# Patient Record
Sex: Female | Born: 1980 | Hispanic: No | Marital: Married | Smoking: Never smoker
Health system: Southern US, Community
[De-identification: ages and names within clinical notes are randomized; demographics above are authoritative.]

## PROBLEM LIST (undated history)

## (undated) ENCOUNTER — Inpatient Hospital Stay (HOSPITAL_COMMUNITY): Payer: Self-pay

## (undated) DIAGNOSIS — B1910 Unspecified viral hepatitis B without hepatic coma: Secondary | ICD-10-CM

## (undated) DIAGNOSIS — E611 Iron deficiency: Secondary | ICD-10-CM

---

## 2008-08-06 ENCOUNTER — Ambulatory Visit (HOSPITAL_COMMUNITY): Admission: RE | Admit: 2008-08-06 | Discharge: 2008-08-06 | Payer: Self-pay | Admitting: Obstetrics

## 2008-09-21 ENCOUNTER — Inpatient Hospital Stay (HOSPITAL_COMMUNITY): Admission: AD | Admit: 2008-09-21 | Discharge: 2008-09-21 | Payer: Self-pay | Admitting: Obstetrics

## 2008-09-28 ENCOUNTER — Encounter (INDEPENDENT_AMBULATORY_CARE_PROVIDER_SITE_OTHER): Payer: Self-pay | Admitting: Obstetrics

## 2008-09-28 ENCOUNTER — Inpatient Hospital Stay (HOSPITAL_COMMUNITY): Admission: AD | Admit: 2008-09-28 | Discharge: 2008-09-30 | Payer: Self-pay | Admitting: Obstetrics

## 2010-01-17 NOTE — L&D Delivery Note (Signed)
Delivery Summary for [employer]  Labor Events:   Preterm labor:   Rupture date:   Rupture time:   Rupture type:   Fluid Color:   Induction:   Augmentation:   Complications:   Cervical ripening:          Delivery:   Episiotomy:   Lacerations:   Repair suture:   Repair # of packets:   Blood loss (ml): 300   Information for the patient's newborn:  Katelyn Webb, Katelyn Webb [[id number]]    Delivery 07/24/2010 7:20 AM by  Vaginal, Spontaneous Delivery Sex:  female Gestational Age: <None> Delivery Clinician:  Greig Right Living?: Yes        APGARS  One minute Five minutes Ten minutes  Skin color: 1   1      Heart rate: 2   2      Grimace: 2   2      Muscle tone: 2   2      Breathing: 2   2      Totals: 9  9      Presentation/position: Vertex  Left Occiput Anterior Resuscitation: None  Cord information: 3 vessels   Disposition of cord blood: No    Blood gases sent? No Complications: None  Placenta: Delivered: 07/24/2010 7:23 AM  Spontaneous  Intact appearance Newborn Measurements: Weight: 6 lb 13.7 oz (3110 g)  Height: 19"  Head circumference: 33 cm  Chest circumference: 33 cm  Other providers: Delivery Nurse Ginger Cher Nakai  Additional  information: Forceps:   Vacuum:   Breech:   Observed anomalies       Pt progressed quickly to C/C?+2 with SROM before antibiotics could be administered.  Pt had NSVD without complications.  Placenta separated spontaneously and delivered via CCT, intact with 3VC.  No lacs.

## 2010-02-08 ENCOUNTER — Encounter: Payer: Self-pay | Admitting: Obstetrics

## 2010-02-16 ENCOUNTER — Inpatient Hospital Stay (HOSPITAL_COMMUNITY): Admission: AD | Admit: 2010-02-16 | Payer: Self-pay | Source: Home / Self Care | Admitting: Obstetrics & Gynecology

## 2010-02-17 ENCOUNTER — Other Ambulatory Visit: Payer: Self-pay | Admitting: Family Medicine

## 2010-02-17 DIAGNOSIS — Z3689 Encounter for other specified antenatal screening: Secondary | ICD-10-CM

## 2010-02-17 LAB — ABO/RH: RH Type: POSITIVE

## 2010-02-17 LAB — RUBELLA ANTIBODY, IGM: Rubella: IMMUNE

## 2010-02-17 LAB — HIV ANTIBODY (ROUTINE TESTING W REFLEX): HIV: NONREACTIVE

## 2010-02-18 LAB — HEPATITIS B SURFACE ANTIGEN: Hepatitis B Surface Ag: POSITIVE

## 2010-02-22 ENCOUNTER — Encounter (HOSPITAL_COMMUNITY): Payer: Self-pay

## 2010-02-22 ENCOUNTER — Ambulatory Visit (HOSPITAL_COMMUNITY)
Admission: RE | Admit: 2010-02-22 | Discharge: 2010-02-22 | Disposition: A | Discharge status: Home / Self Care | Payer: Medicaid Other | Source: Ambulatory Visit | Attending: Family Medicine | Admitting: Family Medicine

## 2010-02-22 DIAGNOSIS — Z363 Encounter for antenatal screening for malformations: Secondary | ICD-10-CM | POA: Insufficient documentation

## 2010-02-22 DIAGNOSIS — Z1389 Encounter for screening for other disorder: Secondary | ICD-10-CM | POA: Insufficient documentation

## 2010-02-22 DIAGNOSIS — O358XX0 Maternal care for other (suspected) fetal abnormality and damage, not applicable or unspecified: Secondary | ICD-10-CM | POA: Insufficient documentation

## 2010-02-22 DIAGNOSIS — Z3689 Encounter for other specified antenatal screening: Secondary | ICD-10-CM

## 2010-03-29 ENCOUNTER — Other Ambulatory Visit: Payer: Self-pay | Admitting: *Deleted

## 2010-03-29 ENCOUNTER — Ambulatory Visit
Admission: RE | Admit: 2010-03-29 | Discharge: 2010-03-29 | Disposition: A | Discharge status: Home / Self Care | Payer: No Typology Code available for payment source | Source: Ambulatory Visit | Attending: *Deleted | Admitting: *Deleted

## 2010-03-29 DIAGNOSIS — R7611 Nonspecific reaction to tuberculin skin test without active tuberculosis: Secondary | ICD-10-CM

## 2010-04-23 LAB — CBC
HCT: 33.6 % — ABNORMAL LOW (ref 36.0–46.0)
HCT: 37.2 % (ref 36.0–46.0)
Hemoglobin: 11.2 g/dL — ABNORMAL LOW (ref 12.0–15.0)
MCV: 88.5 fL (ref 78.0–100.0)
Platelets: 84 10*3/uL — ABNORMAL LOW (ref 150–400)
RBC: 3.79 MIL/uL — ABNORMAL LOW (ref 3.87–5.11)
RBC: 4.2 MIL/uL (ref 3.87–5.11)
WBC: 7.1 10*3/uL (ref 4.0–10.5)

## 2010-04-23 LAB — RPR: RPR Ser Ql: NONREACTIVE

## 2010-05-19 LAB — STREP B DNA PROBE: GBS: POSITIVE

## 2010-05-25 ENCOUNTER — Inpatient Hospital Stay (HOSPITAL_COMMUNITY)
Admission: AD | Admit: 2010-05-25 | Discharge: 2010-05-26 | DRG: 781 | Disposition: A | Discharge status: Home / Self Care | Payer: Medicaid Other | Source: Ambulatory Visit | Attending: Obstetrics and Gynecology | Admitting: Obstetrics and Gynecology

## 2010-05-25 DIAGNOSIS — O9989 Other specified diseases and conditions complicating pregnancy, childbirth and the puerperium: Secondary | ICD-10-CM

## 2010-05-25 DIAGNOSIS — Z22330 Carrier of Group B streptococcus: Secondary | ICD-10-CM

## 2010-05-25 DIAGNOSIS — R8271 Bacteriuria: Secondary | ICD-10-CM

## 2010-05-25 DIAGNOSIS — O99891 Other specified diseases and conditions complicating pregnancy: Principal | ICD-10-CM | POA: Diagnosis present

## 2010-06-01 NOTE — Discharge Summary (Signed)
  Katelyn Webb, Katelyn Webb            ACCOUNT NO.:  [id number]  MEDICAL RECORD NO.:  [id number]           PATIENT TYPE:  I  LOCATION:  9159                          FACILITY:  [facility]  PHYSICIAN:  Catalina Antigua, MD     DATE OF BIRTH:  [birth year]  DATE OF ADMISSION:  05/25/2010 DATE OF DISCHARGE:  05/26/2010                              DISCHARGE SUMMARY   ADMISSION DIAGNOSIS:  Asymptomatic bacteriuria with Pseudomonas and Group B streptococcus.  HISTORY OF PRESENT ILLNESS:  This is a 30 year old G3, P2-0-0-2 at 60 and 1 week with asymptomatic bacteriuria who was admitted to initiate a course of IV antibiotics for a treatment of her Pseudomonas and Group B strep bacteriuria.  The patient had been previously counseled on either IV antibiotics versus oral antibiotics with the use of a quinolone. Risks and benefits of both treatment options were explained.  The patient verbalized understanding and all questions were answered.  The patient opted for IV antibiotics.  The patient was admitted overnight for initiation of IV antibiotics with ceftazidime 1 g IV q.8 h. and home health care was initiated to continue IV antibiotic administration for a total of 7 days while the patient is at home.  Throughout her hospital stay, the patient remained stable with normal vital signs and reassuring fetal heart rate tracing.  No immediate allergic reactions were noted to the antibiotics.  On the day of discharge, the patient continued to be asymptomatic without any complaints.  Fetal monitoring revealed a reassuring tracing and contractions pattern were found to be 3-7 minutes.  The patient was unaware of her contractions.  Sterile vaginal exam showed cervix to be closed, firm and posterior.  Discharge instructions were provided to the patient.  The patient was advised to return to the MAU if she developed a rash or hives after prolonged use of antibiotics or if she developed contractions every 3-5 minutes  or every 10-15 minutes, decreased fetal movement or leakage of fluid.  The patient is scheduled to follow up with her scheduled appointment at the healthcare department on Jun 02, 2010.     Catalina Antigua, MD     PC/MEDQ  D:  05/26/2010  T:  05/26/2010  Job:  [id number]  Electronically Signed by Catalina Antigua  on 06/01/2010 07:02:47 PM

## 2010-07-24 ENCOUNTER — Inpatient Hospital Stay (HOSPITAL_COMMUNITY)
Admission: AD | Admit: 2010-07-24 | Discharge: 2010-07-26 | DRG: 775 | Disposition: A | Discharge status: Home / Self Care | Payer: Medicaid Other | Source: Ambulatory Visit | Attending: Family Medicine | Admitting: Family Medicine

## 2010-07-24 ENCOUNTER — Encounter (HOSPITAL_COMMUNITY): Payer: Self-pay | Admitting: *Deleted

## 2010-07-24 ENCOUNTER — Inpatient Hospital Stay (HOSPITAL_COMMUNITY): Payer: Medicaid Other | Admitting: Family Medicine

## 2010-07-24 DIAGNOSIS — B1910 Unspecified viral hepatitis B without hepatic coma: Secondary | ICD-10-CM | POA: Diagnosis present

## 2010-07-24 LAB — CBC
HCT: 39.4 % (ref 36.0–46.0)
Hemoglobin: 12.7 g/dL (ref 12.0–15.0)
MCV: 88.3 fL (ref 78.0–100.0)
RBC: 4.46 MIL/uL (ref 3.87–5.11)
RDW: 13.3 % (ref 11.5–15.5)
WBC: 9.8 10*3/uL (ref 4.0–10.5)

## 2010-07-24 LAB — RPR: RPR Ser Ql: NONREACTIVE

## 2010-07-24 MED ORDER — OXYCODONE-ACETAMINOPHEN 5-325 MG PO TABS
1.0000 | ORAL_TABLET | ORAL | Status: DC | PRN
  Administered 2010-07-24: 1 via ORAL
  Filled 2010-07-24: qty 1

## 2010-07-24 MED ORDER — PRENATAL PLUS 27-1 MG PO TABS
1.0000 | ORAL_TABLET | Freq: Every day | ORAL | Status: DC
  Administered 2010-07-24 – 2010-07-26 (×3): 1 via ORAL
  Filled 2010-07-24 (×3): qty 1

## 2010-07-24 MED ORDER — LIDOCAINE HCL (PF) 1 % IJ SOLN
1.0000 mL | Freq: Once | INTRAMUSCULAR | Status: AC | PRN
  Filled 2010-07-24: qty 2

## 2010-07-24 MED ORDER — LACTATED RINGERS IV SOLN: INTRAVENOUS | Status: DC

## 2010-07-24 MED ORDER — FLEET ENEMA 7-19 GM/118ML RE ENEM
1.0000 | ENEMA | RECTAL | Status: DC | PRN
  Filled 2010-07-24: qty 1

## 2010-07-24 MED ORDER — IBUPROFEN 600 MG PO TABS
ORAL_TABLET | ORAL | Status: AC
  Administered 2010-07-24: 600 mg via ORAL
  Filled 2010-07-24: qty 1

## 2010-07-24 MED ORDER — SENNOSIDES-DOCUSATE SODIUM 8.6-50 MG PO TABS
1.0000 | ORAL_TABLET | Freq: Every day | ORAL | Status: DC
  Administered 2010-07-24: 1 via ORAL
  Administered 2010-07-25: 2 via ORAL

## 2010-07-24 MED ORDER — ONDANSETRON HCL 4 MG PO TABS: 4.0000 mg | ORAL_TABLET | ORAL | Status: DC | PRN

## 2010-07-24 MED ORDER — RHO D IMMUNE GLOBULIN 1500 UNIT/2ML IJ SOLN: 300.0000 ug | Freq: Once | INTRAMUSCULAR | Status: DC

## 2010-07-24 MED ORDER — IBUPROFEN 600 MG PO TABS
600.0000 mg | ORAL_TABLET | Freq: Four times a day (QID) | ORAL | Status: DC | PRN
  Administered 2010-07-24 – 2010-07-25 (×2): 600 mg via ORAL
  Filled 2010-07-24 (×4): qty 1

## 2010-07-24 MED ORDER — ONDANSETRON HCL 4 MG/2ML IJ SOLN: 4.0000 mg | INTRAMUSCULAR | Status: DC | PRN

## 2010-07-24 MED ORDER — ONDANSETRON HCL 4 MG/2ML IJ SOLN: 4.0000 mg | Freq: Four times a day (QID) | INTRAMUSCULAR | Status: DC | PRN

## 2010-07-24 MED ORDER — LANOLIN HYDROUS EX OINT
TOPICAL_OINTMENT | CUTANEOUS | Status: DC | PRN
  Filled 2010-07-24: qty 28

## 2010-07-24 MED ORDER — LIDOCAINE HCL (PF) 1 % IJ SOLN
INTRAMUSCULAR | Status: AC
  Filled 2010-07-24: qty 30

## 2010-07-24 MED ORDER — WITCH HAZEL-GLYCERIN EX PADS: MEDICATED_PAD | CUTANEOUS | Status: DC | PRN

## 2010-07-24 MED ORDER — DIPHENHYDRAMINE HCL 25 MG PO CAPS: 25.0000 mg | ORAL_CAPSULE | Freq: Four times a day (QID) | ORAL | Status: DC | PRN

## 2010-07-24 MED ORDER — ZOLPIDEM TARTRATE 5 MG PO TABS: 5.0000 mg | ORAL_TABLET | Freq: Every evening | ORAL | Status: DC | PRN

## 2010-07-24 MED ORDER — ACETAMINOPHEN 325 MG PO TABS: 650.0000 mg | ORAL_TABLET | ORAL | Status: DC | PRN

## 2010-07-24 MED ORDER — SIMETHICONE 80 MG PO CHEW: 80.0000 mg | CHEWABLE_TABLET | ORAL | Status: DC | PRN

## 2010-07-24 MED ORDER — OXYTOCIN 20 UNITS IN LACTATED RINGERS INFUSION - SIMPLE: 125.0000 mL/h | Freq: Once | INTRAVENOUS | Status: DC

## 2010-07-24 MED ORDER — LACTATED RINGERS IV SOLN: 500.0000 mL | Freq: Once | INTRAVENOUS | Status: AC | PRN

## 2010-07-24 MED ORDER — BENZOCAINE-MENTHOL 20-0.5 % EX AERO: 1.0000 "application " | INHALATION_SPRAY | CUTANEOUS | Status: DC | PRN

## 2010-07-24 MED ORDER — CITRIC ACID-SODIUM CITRATE 334-500 MG/5ML PO SOLN
30.0000 mL | ORAL | Status: DC | PRN
  Filled 2010-07-24: qty 30

## 2010-07-24 MED ORDER — IBUPROFEN 600 MG PO TABS
600.0000 mg | ORAL_TABLET | Freq: Four times a day (QID) | ORAL | Status: DC
  Administered 2010-07-24 – 2010-07-26 (×6): 600 mg via ORAL
  Filled 2010-07-24 (×2): qty 1

## 2010-07-24 MED ORDER — OXYTOCIN 10 UNIT/ML IJ SOLN
INTRAMUSCULAR | Status: AC
  Administered 2010-07-24: 10 [IU]
  Filled 2010-07-24: qty 2

## 2010-07-24 MED ORDER — ERYTHROMYCIN 5 MG/GM OP OINT
TOPICAL_OINTMENT | OPHTHALMIC | Status: AC
  Administered 2010-07-24: 07:00:00 via OPHTHALMIC
  Filled 2010-07-24: qty 1

## 2010-07-24 NOTE — Progress Notes (Signed)
PT ARRIVED - IN LOBBY- UNCOMFORTABLE- BROUGHT TO RM 4 VIA W/C.   G3P2, EDC- 07-26-2010 NKA BLOODYSHOW   , POS GBS   VE- 6 CM - INTACT, BLOODYSHOW. FHR- 145.   PT HOLLERING TANYA- CALLED DR LAVOIE.  PNR SAYS HD- CALLED FRAN, CNM  .  TO RM 166 VIA W/C

## 2010-07-24 NOTE — H&P (Signed)
Katelyn Webb is a 30 y.o. female presenting for labor Maternal Medical History:  Reason for admission: Reason for admission: contractions.  Contractions: Onset was 1-2 hours ago.   Frequency: regular.   Duration is approximately 60 seconds.   Perceived severity is strong.    Fetal activity: Perceived fetal activity is normal.   Last perceived fetal movement was within the past hour.    Prenatal complications: Infection.   + HBSaG     No past medical history on file. No past surgical history on file. Family History: family history is not on file. Social History:  does not have a smoking history on file. She does not have any smokeless tobacco history on file. Her alcohol and drug histories not on file.  ROS    Last menstrual period 10/19/2009, unknown if currently breastfeeding. Exam Physical Exam  Prenatal labs: ABO, Rh:   Antibody:   Rubella:   RPR:    HBsAg:    HIV:    GBS:     Assessment/Plan: G3P2002 @ 39 weeks in active labor.  Cx 6cms100%/-1 sta BBOW.  FHR Category 1.  Admit to L&D  Katelyn Webb 07/24/2010, 7:47 AM

## 2010-07-25 NOTE — Progress Notes (Signed)
Post Partum Day1 Subjective:  no complaints.   Objective: Blood pressure 99/66, pulse 73, temperature 97.6 F (36.4 C), temperature source Oral, resp. rate 18, height 5\' 1"  (1.549 m), weight 67.132 kg (148 lb), last menstrual period 10/19/2009, unknown if currently breastfeeding.  Physical Exam:  General:  Lochia: {Desc; appropriate/ Uterine Fundus: {Desc; firm/ Incision:  DVT Evaluation: {Exam;WNL   Basename 07/24/10 1008  HGB 12.7  HCT 39.4    Assessment/Plan:  Routine pp care   LOS: 1 day   Zerita Boers 07/25/2010, 8:55 AM   S: Denies any complaints. Voiding without difficulty. Taking po's without difficulty. O: VSS, Heart RRR, LCTAB, Abd soft nontender. Fundus firm, lochia sm amt. No edema in lower extremities. A: stable PPD#1 P: routine post partum care.

## 2010-07-25 NOTE — Progress Notes (Signed)
Experienced BF mother. States she doesn't have much milk so has been giving bottles too. Encouraged to always BF first to promote milk supply. No questions at present.

## 2010-07-26 DIAGNOSIS — B1910 Unspecified viral hepatitis B without hepatic coma: Secondary | ICD-10-CM | POA: Diagnosis present

## 2010-07-26 DIAGNOSIS — O98419 Viral hepatitis complicating pregnancy, unspecified trimester: Secondary | ICD-10-CM | POA: Diagnosis present

## 2010-07-26 MED ORDER — PRENATAL PLUS 27-1 MG PO TABS: 1.0000 | ORAL_TABLET | Freq: Every day | ORAL | Status: DC

## 2010-07-26 MED ORDER — IBUPROFEN 600 MG PO TABS: 600.0000 mg | ORAL_TABLET | Freq: Four times a day (QID) | ORAL | Status: AC

## 2010-07-26 NOTE — Discharge Summary (Signed)
Obstetric Discharge Summary Reason for Admission: onset of labor Prenatal Procedures: none Intrapartum Procedures: spontaneous vaginal delivery Postpartum Procedures: none Complications-Operative and Postpartum: none  Hemoglobin  Date Value Range Status  07/24/2010 12.7  12.0-15.0 (g/dL) Final     HCT  Date Value Range Status  07/24/2010 39.4  36.0-46.0 (%) Final    Discharge Diagnoses: Term Pregnancy-delivered  Discharge Information: Date: 07/26/2010 Activity: pelvic rest Diet: routine Medications: PNV and Ibuprophen Condition: stable Instructions: refer to practice specific booklet Discharge to: home Follow-up Information    Follow up with [place] [facility] Make an appointment in 5 weeks.         Newborn Data: Live born  Information for the patient's newborn:  Saphyra, Hutt [[id number]]  female ; APGAR , ; weight ;  Home with mother.  Audia Amick S 07/26/2010, 2:08 PM

## 2010-07-26 NOTE — Progress Notes (Signed)
Post Partum Day 2 Subjective: No complaints  Objective: Blood pressure 96/61, pulse 75, temperature 98 F (36.7 C), temperature source Oral, resp. rate 18, height 5\' 1"  (1.549 m), weight 67.132 kg (148 lb), last menstrual period 10/19/2009, unknown if currently breastfeeding.  Physical Exam:  General:WNL Lochia: Sm amt Uterine Fundus: firm Incision: N/A DVT Evaluation: Neg Homan's   Basename 07/24/10 1008  HGB 12.7  HCT 39.4    Assessment/Plan: Stable PPD#2. D/C home.   LOS: 2 days   Zerita Boers 07/26/2010, 6:31 AM   S: denies complaints. Desires d/c home today. O: VSS, heart RRR, LCTAB, abd soft nontender, fundus firm, lochia sm amt. No edema in lower extremities.

## 2010-07-26 NOTE — Progress Notes (Signed)
UR chart completed Katelyn Webb [facility] BSN

## 2012-01-19 IMAGING — US US OB DETAIL+14 WK
1 series · 12 of 28 positions shown · non-contrast
Comparison: none

[Series 1: us ob detail +14 wk · 12 of 55 slices shown]
[im 3/55]
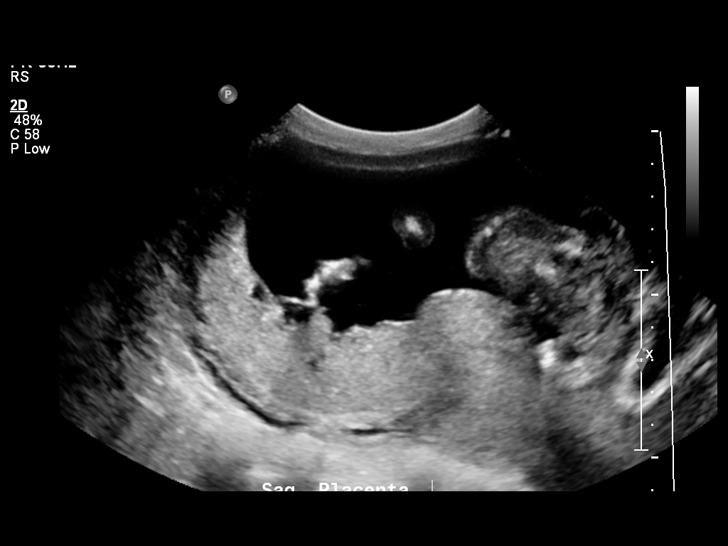
[im 7/55]
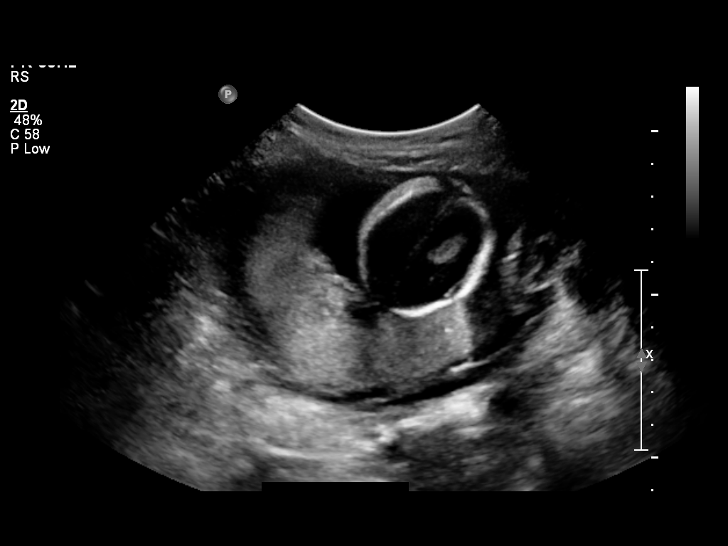
[im 11/55]
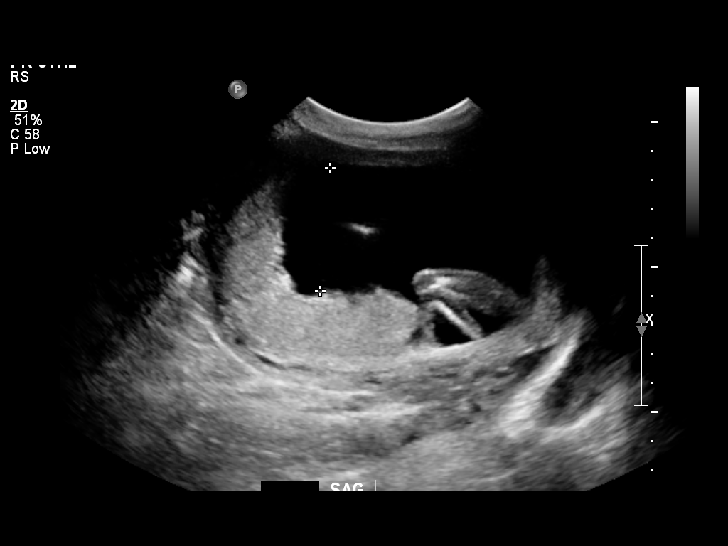
[im 17/55]
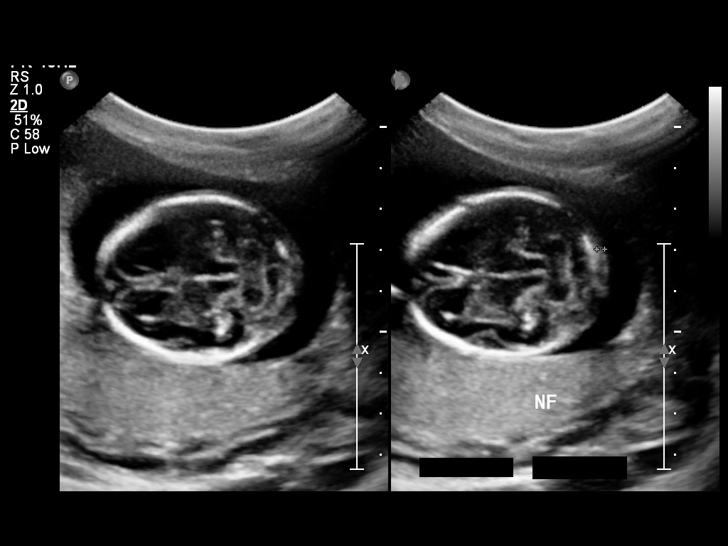
[im 21/55]
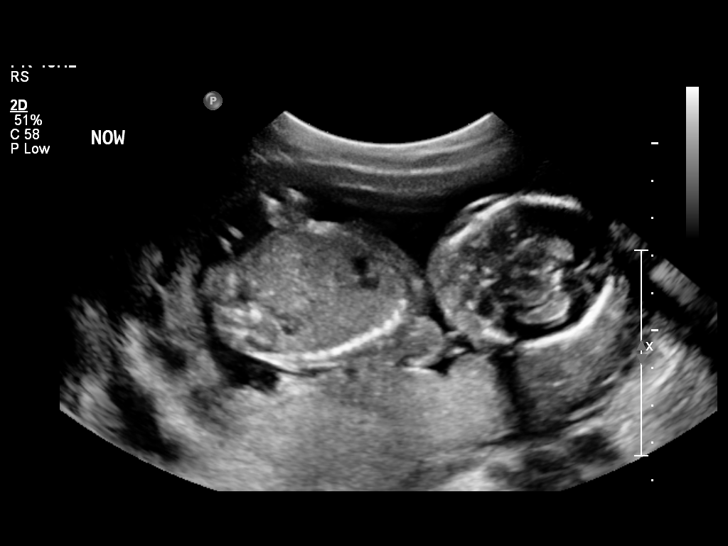
[im 25/55]
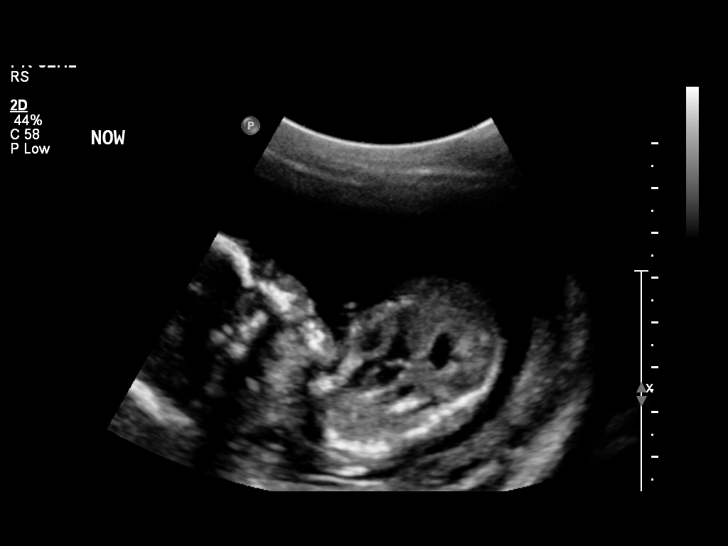
[im 31/55]
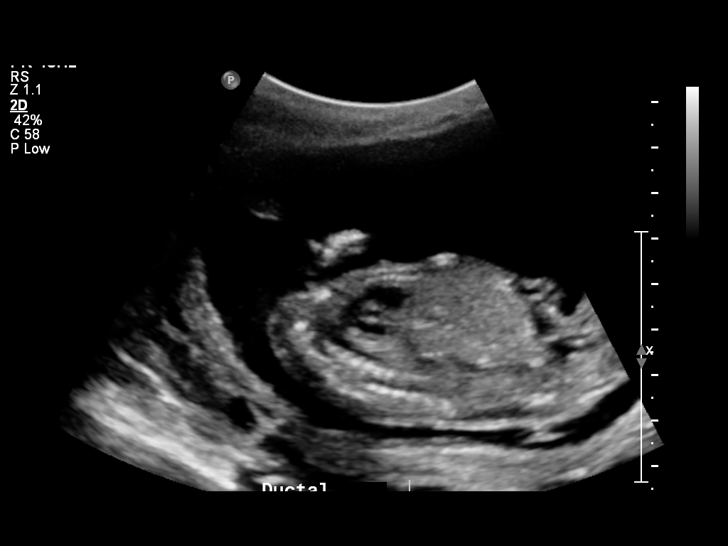
[im 35/55]
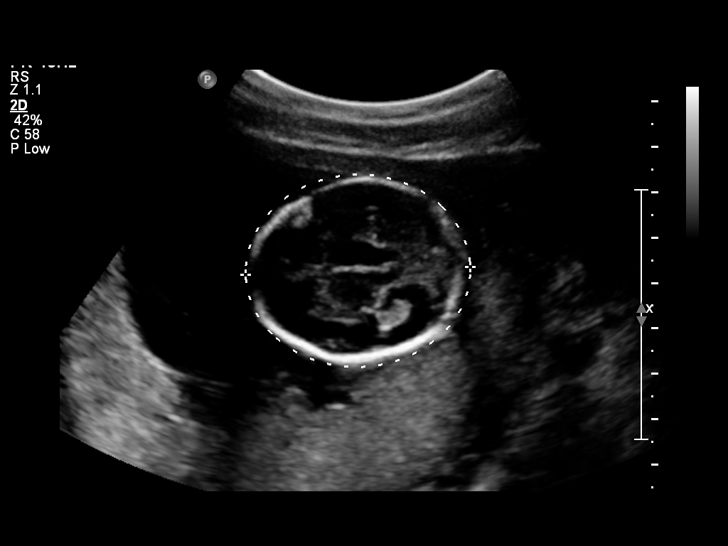
[im 39/55]
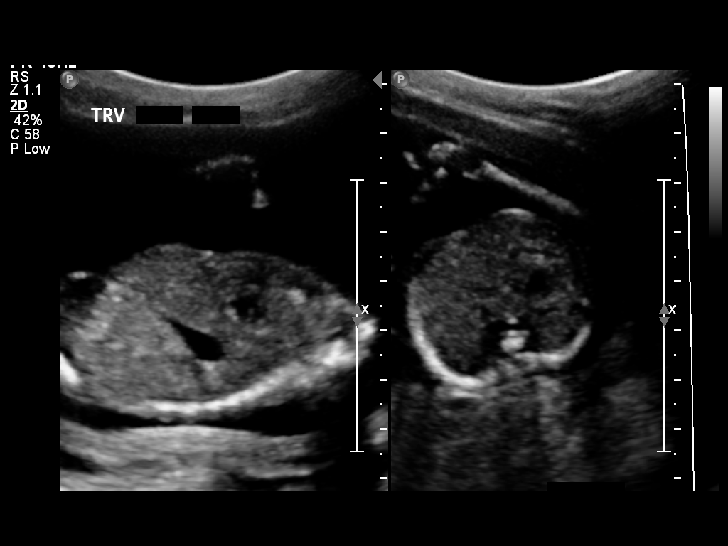
[im 45/55]
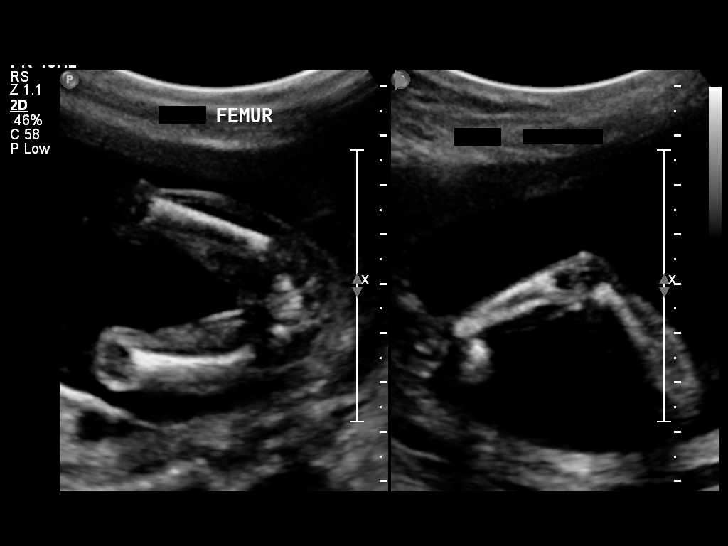
[im 49/55]
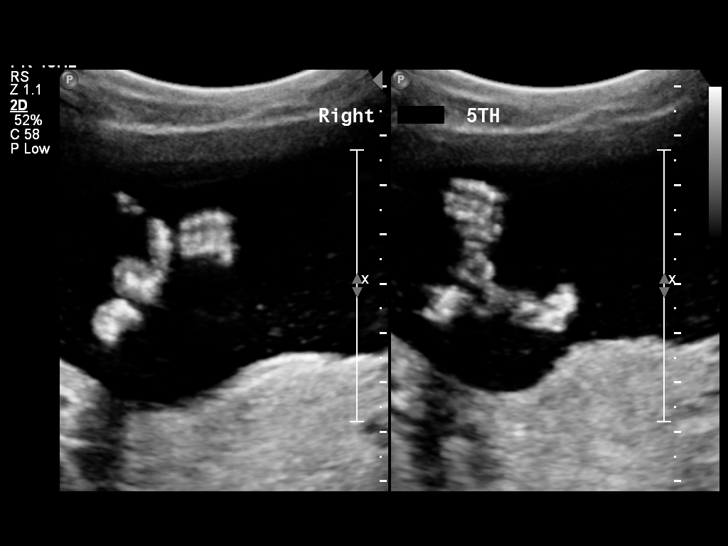
[im 53/55]
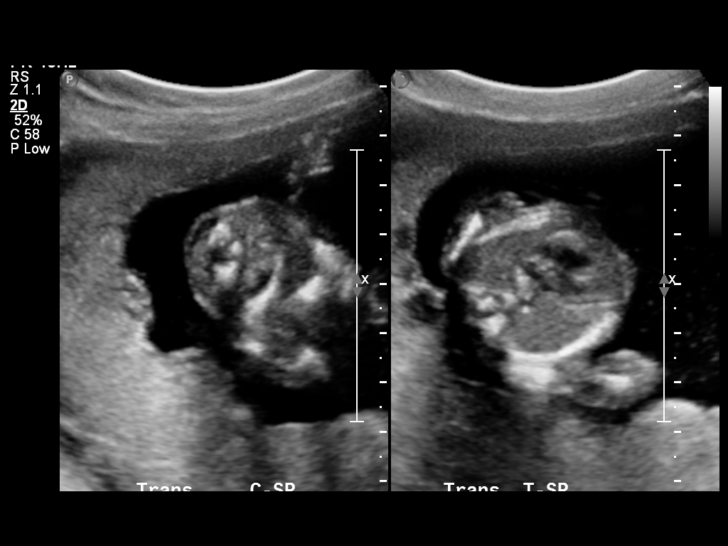

[12 of 28 positions shown; findings below may reference images not displayed]

OBSTETRICS REPORT
                      (Signed Final 02/22/2010 [DATE])

 Order#:         [id number]
Procedures

 US OB DETAIL + 14 WK                                  76811.0
Indications

 Detailed fetal anatomic survey
 Thrombocytopenia, idiopathic or autoimmune (ITP)
 Hepatitis
Fetal Evaluation

 Fetal Heart Rate:  153                          bpm
 Cardiac Activity:  Observed
 Presentation:      Variable
 Placenta:          Posterior, above cervical
                    os
 P. Cord            Visualized
 Insertion:

 Amniotic Fluid
 AFI FV:      Subjectively within normal limits
                                             Larg Pckt:     4.2  cm
Biometry

 BPD:     39.1  mm     G. Age:  17w 6d                CI:        74.58   70 - 86
                                                      FL/HC:      18.6   15.8 -
                                                                         18
 HC:     143.7  mm     G. Age:  17w 4d       23  %    HC/AC:      1.21   1.07 -

 AC:     118.6  mm     G. Age:  17w 4d       34  %    FL/BPD:
 FL:      26.7  mm     G. Age:  18w 1d       50  %    FL/AC:      22.5   20 - 24
 HUM:     25.7  mm     G. Age:  18w 0d       58  %
 NFT:      1.9  mm

 Est. FW:     211  gm      0 lb 7 oz     45  %
Gestational Age

 LMP:           18w 0d        Date:  10/19/09                 EDD:   07/26/10
 U/S Today:     17w 5d                                        EDD:   07/28/10
 Best:          18w 0d     Det. By:  LMP  (10/19/09)          EDD:   07/26/10
Genetic Sonogram - Trisomy 21 Screening

 Age:                                             29          Risk=1:   680
 Echogenic bowel:                                 No          LR :
 Hypoplastic/absent Nasal bone:                   No
 Choroid plexus cysts:                            No
 Structural anomalies (inc. cardiac):             No          LR :
 Hypoplastic / absent midphalanx 5th Digit:       No
 Short femur:                                     No          LR :
 Short humerus:                                   No          LR :
 2-vessel umbilical cord:                         No
 Pyelectasis:                                     No          LR :
 Echogenic cardiac foci:                          No          LR :
 Nuchal fold thickening >= 6 mm:                  No          LR :

 11 Of 11 Criteria Were Visualized and 0 Abnormal(s) Were Seen.
 Ultrasound Modified Risk for Fetal Down Syndrome = [DATE]
Anatomy

 Cranium:           Appears normal      Aortic Arch:       Appears normal
 Fetal Cavum:       Appears normal      Ductal Arch:       Appears normal
 Ventricles:        Appears normal      Diaphragm:         Appears normal
 Choroid Plexus:    Appears normal      Stomach:           Appears normal
 Cerebellum:        Appears normal      Abdomen:           Appears normal
 Posterior Fossa:   Appears normal      Abdominal Wall:    Appears nml
                                                           (cord insert,
                                                           abd wall)
 Nuchal Fold:       Appears normal      Cord Vessels:      Appears normal
                    (neck, nuchal                          (3 vessel cord)
                    fold)
 Face:              Appears normal      Kidneys:           Appear normal
                    (lips/profile/orbit
                    s)
 Heart:             Appears normal      Bladder:           Appears normal
                    (4 chamber &
                    axis)
 RVOT:              Appears normal      Spine:             Appears normal
 LVOT:              Appears normal      Limbs:             Appears normal
                                                           (hands, ankles,
                                                           feet)

 Other:     Female gender. Heels and 5th digit visualized. Nasal
            bone visualized.
Cervix Uterus Adnexa

 Cervical Length:    3.14     cm

 Cervix:       Closed.
 Left Ovary:    Within normal limits.
 Right Ovary:   Within normal limits.

 Adnexa:     No abnormality visualized.
Impression

 Single living IUP.  US EGA is concordant with LMP.
 No fetal anomalies seen involving visualized anatomy.
 No sonographic markers for aneuploidy visualized.

## 2013-02-02 ENCOUNTER — Emergency Department (HOSPITAL_COMMUNITY)
Admission: EM | Admit: 2013-02-02 | Discharge: 2013-02-02 | Disposition: A | Discharge status: Home / Self Care | Payer: 59 | Attending: Emergency Medicine | Admitting: Emergency Medicine

## 2013-02-02 ENCOUNTER — Encounter (HOSPITAL_COMMUNITY): Payer: Self-pay | Admitting: Emergency Medicine

## 2013-02-02 DIAGNOSIS — M25531 Pain in right wrist: Secondary | ICD-10-CM

## 2013-02-02 DIAGNOSIS — M25532 Pain in left wrist: Secondary | ICD-10-CM

## 2013-02-02 DIAGNOSIS — M25539 Pain in unspecified wrist: Secondary | ICD-10-CM | POA: Insufficient documentation

## 2013-02-02 MED ORDER — NAPROXEN 500 MG PO TABS: 500.0000 mg | ORAL_TABLET | Freq: Two times a day (BID) | ORAL | Status: DC

## 2013-02-02 NOTE — ED Provider Notes (Signed)
CSN: [id number]     Arrival date & time 02/02/13  1508 History  This chart was scribed for Santiago GladHeather Jakarie Pember, PA, working with Gavin PoundMichael Y. Oletta LamasGhim, MD, by Ardelia Memsylan Malpass ED Scribe. This patient was seen in room TR10C/TR10C and the patient's care was started at 5:06 PM.   Chief Complaint  Patient presents with  . Wrist Pain    The history is provided by the patient. No language interpreter was used.    HPI Comments: Katelyn Karie Mainlandli is a 33 y.o. female who presents to the Emergency Department complaining of intermittent bilateral wrist pain over the past 5 years. She denies any injury to have onset this pain. She states that her hand pain is worsened with lifting heavy objects. She also states that her pain is worst at night, when it is a "throbbing pain." She states that she has been taking Tylenol and Ibuprofen with relief of her pain. She states that she has not been evaluated for this pain in the past. She denies any associated redness or swelling to her wrists. She denies any numbness or paresthesias to the fingers of her bilateral hands. She denies fever, chills or any other symptoms.   History reviewed. No pertinent past medical history. History reviewed. No pertinent past surgical history. Family History  Problem Relation Age of Onset  . Anesthesia problems Neg Hx   . Hypotension Neg Hx   . Malignant hyperthermia Neg Hx   . Pseudochol deficiency Neg Hx    History  Substance Use Topics  . Smoking status: Never Smoker   . Smokeless tobacco: Never Used  . Alcohol Use: No   OB History   Grav Para Term Preterm Abortions TAB SAB Ect Mult Living   3 3 3       3      Review of Systems  Constitutional: Negative for fever and chills.  Musculoskeletal: Positive for arthralgias (bilateral wrists).  All other systems reviewed and are negative.   Allergies  Review of patient's allergies indicates no known allergies.  Home Medications  No current outpatient prescriptions on  file.  Triage Vitals: BP 115/62  Pulse 80  Temp(Src) 98 F (36.7 C) (Oral)  Resp 18  SpO2 100%  Physical Exam  Nursing note and vitals reviewed. Constitutional: She is oriented to person, place, and time. She appears well-developed and well-nourished. No distress.  HENT:  Head: Normocephalic and atraumatic.  Eyes: EOM are normal.  Neck: Neck supple. No tracheal deviation present.  Cardiovascular: Normal rate and intact distal pulses.   2+ radial pulses bilaterally.  Pulmonary/Chest: Effort normal. No respiratory distress.  Musculoskeletal: Normal range of motion. She exhibits tenderness.  Mild, diffuse tenderness to palpation of bilateral wrists- worse along the ulnar aspects of the wrists. No erythema, edema or warmth of the wrists. Full ROM of bilateral wrists. Increased pain with ROM of the left wrist. Negative Finkelsteins test. Negative Phalen's and Tinnel's signs.  Neurological: She is alert and oriented to person, place, and time.   Distal sensation of fingers of both hands intact.  Skin: Skin is warm and dry.  Psychiatric: She has a normal mood and affect. Her behavior is normal.    ED Course  Procedures (including critical care time)  DIAGNOSTIC STUDIES: Oxygen Saturation is 100% on RA, normal by my interpretation.    COORDINATION OF CARE: 5:13 PM- Advised pt of plan to splint left wrist. Pt advised of plan for treatment and pt agrees.  Labs Review Labs Reviewed - No data  to display Imaging Review No results found.  EKG Interpretation   None       MDM   1. Bilateral wrist pain   Patient with bilateral wrist pain, worse on the left that has been present for the past 5 years.  No signs of infection.  Full ROM of the wrist.  Patient is neurovascularly intact.  Patient given wrist splint.    I personally performed the services described in this documentation, which was scribed in my presence. The recorded information has been reviewed and is  accurate.    Santiago Glad, PA-C 02/02/13 1928

## 2013-02-02 NOTE — ED Notes (Signed)
Pt came in with c/o bilateral wrist pain and "swelling" x several days.  Pt has not had any medication.  CMS intact to hands.  NAD.  Pt denies trauma to wrists.

## 2013-02-03 NOTE — ED Provider Notes (Signed)
Medical screening examination/treatment/procedure(s) were performed by non-physician practitioner and as supervising physician I was immediately available for consultation/collaboration.  EKG Interpretation   None         Olly Shiner Y. Dionysios Massman, MD 02/03/13 2354 

## 2013-05-04 ENCOUNTER — Encounter (HOSPITAL_COMMUNITY): Payer: Self-pay | Admitting: Emergency Medicine

## 2013-05-04 ENCOUNTER — Emergency Department (HOSPITAL_COMMUNITY)
Admission: EM | Admit: 2013-05-04 | Discharge: 2013-05-04 | Disposition: A | Discharge status: Home / Self Care | Payer: 59 | Source: Home / Self Care | Attending: Family Medicine | Admitting: Family Medicine

## 2013-05-04 DIAGNOSIS — IMO0001 Reserved for inherently not codable concepts without codable children: Secondary | ICD-10-CM

## 2013-05-04 DIAGNOSIS — M791 Myalgia, unspecified site: Secondary | ICD-10-CM

## 2013-05-04 DIAGNOSIS — R1110 Vomiting, unspecified: Secondary | ICD-10-CM

## 2013-05-04 LAB — POCT PREGNANCY, URINE: Preg Test, Ur: NEGATIVE

## 2013-05-04 MED ORDER — ONDANSETRON 4 MG PO TBDP
8.0000 mg | ORAL_TABLET | Freq: Once | ORAL | Status: AC
  Administered 2013-05-04: 8 mg via ORAL

## 2013-05-04 MED ORDER — ONDANSETRON HCL 8 MG PO TABS
8.0000 mg | ORAL_TABLET | Freq: Three times a day (TID) | ORAL | Status: DC | PRN
Start: 2013-05-04 — End: 2013-10-04

## 2013-05-04 MED ORDER — DICLOFENAC SODIUM 50 MG PO TBEC: 50.0000 mg | DELAYED_RELEASE_TABLET | Freq: Two times a day (BID) | ORAL | Status: DC | PRN

## 2013-05-04 MED ORDER — ONDANSETRON 4 MG PO TBDP
ORAL_TABLET | ORAL | Status: AC
  Filled 2013-05-04: qty 2

## 2013-05-04 NOTE — ED Provider Notes (Signed)
Katelyn Webb is a 33 y.o. female who presents to Urgent Care today for body aches. Patient has had one week of occasional body aches. Additionally she developed vomiting intermittently recently. She's had a few episodes of vomiting. She denies any fevers chills or diarrhea. She denies significant abdominal pain. She has not tried any medications yet. She denies any weakness or numbness.   History reviewed. No pertinent past medical history. History  Substance Use Topics  . Smoking status: Never Smoker   . Smokeless tobacco: Never Used  . Alcohol Use: No   ROS as above Medications: No current facility-administered medications for this encounter.   Current Outpatient Prescriptions  Medication Sig Dispense Refill  . diclofenac (VOLTAREN) 50 MG EC tablet Take 1 tablet (50 mg total) by mouth 2 (two) times daily as needed.  60 tablet  0  . ondansetron (ZOFRAN) 8 MG tablet Take 1 tablet (8 mg total) by mouth every 8 (eight) hours as needed for nausea or vomiting.  20 tablet  0    Exam:  BP 130/78  Pulse 74  Temp(Src) 97.8 F (36.6 C) (Oral)  Resp 20  SpO2 100%  LMP 04/30/2013  Breastfeeding? No Gen: Well NAD nontoxic appearing HEENT: EOMI,  MMM Lungs: Normal work of breathing. CTABL Heart: RRR no MRG Abd: NABS, Soft. NT, ND Exts: Brisk capillary refill, warm and well perfused. Nonedematous bilateral lower extremities  Results for orders placed during the hospital encounter of 05/04/13 (from the past 24 hour(s))  POCT PREGNANCY, URINE     Status: None   Collection Time    05/04/13  1:50 PM      Result Value Ref Range   Preg Test, Ur NEGATIVE  NEGATIVE   No results found.  Assessment and Plan: 33 y.o. female with viral gastroenteritis. Viral gastroenteritis as the most likely situation at this point. However the differential is broad. She appears to be well. Plan to treat with diclofenac for body aches and Zofran for vomiting.  Discussed warning signs or symptoms. Please  see discharge instructions. Patient expresses understanding.    Rodolph BongEvan S Celestine Prim, MD 05/04/13 [phone]

## 2013-05-04 NOTE — ED Notes (Signed)
C/o   Vomiting and headache that started today.   Also c/o  Joint pain.   Left and right knee pain with swelling in left leg. X  1 wk.   No relief with otc meds.   Hx hep B

## 2013-05-04 NOTE — Discharge Instructions (Signed)
Thank you for coming in today. Use Zofran for vomiting as needed Use diclofenac for joint pain.  Followup with your primary care Dr. if not getting better. Mialgia (Myalgia, Adult) Mialgia es el trmino mdico que Psychologist, occupationaldesigna el dolor muscular. Es un sntoma que puede designar UGI Corporationmuchas cosas. Casi todas las personas lo han sufrido en algn momento de su vida. La causa ms frecuente es el uso excesivo o la distensin, especialmente cuando no se est en forma. Los traumatismos y hematomas pueden causar mialgias. El dolor muscular sin una historia previa de traumatismo tambin puede ser causado por un virus. Generalmente aparece junto con la gripe. La mialgia que no es causada por un esguince muscular puede estar presente en un gran nmero de enfermedades infecciosas. Algunas enfermedades autoinmunes como el lupus y la fibromialgia pueden causar dolor muscular. La mialgia puede ser leve o intensa. SNTOMAS Los sntomas de mialgia son simplemente dolores musculares. Muchas veces duran poco y el dolor desaparece sin tratamiento. DIAGNSTICO El profesional diagnostica la mialgia a travs de la historia clnica. Esto significa que deber decirle al profesional cuando Smithfield Foodscomenzaron los problemas, qu dificultades tiene y que ha ocurrido. Si este problema no ha sido de Owens-Illinoislarga data, Mining engineerel profesional querr observarlo durante un tiempo para ver que ocurre. Si el problema ha sido de Owens-Illinoislarga data, podr Scientist, research (medical)solicitarle pruebas adicionales. TRATAMIENTO El tratamiento depender de la causa subyacente al dolor muscular. Generalmente es necesario prescribir medicamentos antiinflamatorios. INSTRUCCIONES PARA EL CUIDADO DOMICILIARIO   Si el dolor est ocasionado en el uso excesivo, disminuya las actividades The St. Paul Travelershasta que los problemas desaparezcan.  La mialgia por el uso excesivo de un msculo puede tratarse alternando compresas fras y calientes sobre el msculo afectado o con fro Pottersvilledurante los primeros [address]  [address]das y Adult nurseluego calor. Si el calor o el  fro Googleparecen empeorar las cosas, suspenda el Thayeruso.  Aplique hielo sobre la lesin durante 15 a 20 minutos 3 a 4 veces por Physiological scientistda mientras se encuentre despierto durante los 2 primeros das o segn se le indique. Ponga el hielo en una bolsa plstica y coloque una toalla entre la bolsa y la piel.  Utilice los medicamentos de venta libre o de prescripcin para Chief Technology Officerel dolor, Environmental health practitionerel malestar o la Shilohfiebre, segn se lo indique el profesional que lo asiste.  La actividad fsica regular suave puede ayudar si no es Mongoliamuy activo.  La elongacin antes de Chief Financial Officerrealizar ejercicios intensos, podr ayudarlo a Printmakerdisminuir el riesgo de sufrir [place]una mialgia. Es normal al comenzar un programa de ejercicios o actividad fsica sentir algunos dolores musculares luego de Biochemist, clinicalrealizarlos. Los msculos que no han sido utilizados con frecuencia pueden doler al principio. Si el dolor es extremo, podra tener un msculo lesionado. SOLICITE ATENCIN MDICA SI:  El dolor aumenta y no se alivia con los medicamentos.  Le sube la fiebre.  Presenta nuseas o vmitos.  Aparece rigidez y Surveyor, quantitydolor en el cuello.  Aparece una erupcin cutnea.  Comienza a Buyer, retailsentir dolores musculares luego de la picadura de una garrapata  Siente dolor muscular continuo mientras se ejercita an Data processing managerestando en buen estado fsico. SOLICITE ATENCIN MDICA DE INMEDIATO SI: Cualquiera de los problemas empeoran y los medicamentos no lo [place] Plainsalivian. EST SEGURO QUE:   Comprende las instrucciones para el alta mdica.  Controlar su enfermedad.  Solicitar atencin mdica de inmediato segn las indicaciones. Document Released: 04/12/2007 Document Revised: 03/28/2011 [facility] Patient Information 2014 [place]ExitCare, [place].

## 2013-05-16 ENCOUNTER — Emergency Department (HOSPITAL_COMMUNITY): Payer: 59

## 2013-05-16 ENCOUNTER — Emergency Department (HOSPITAL_COMMUNITY)
Admission: EM | Admit: 2013-05-16 | Discharge: 2013-05-16 | Disposition: A | Discharge status: Home / Self Care | Payer: 59 | Attending: Emergency Medicine | Admitting: Emergency Medicine

## 2013-05-16 ENCOUNTER — Encounter (HOSPITAL_COMMUNITY): Payer: Self-pay | Admitting: Emergency Medicine

## 2013-05-16 DIAGNOSIS — N390 Urinary tract infection, site not specified: Secondary | ICD-10-CM

## 2013-05-16 DIAGNOSIS — D696 Thrombocytopenia, unspecified: Secondary | ICD-10-CM

## 2013-05-16 DIAGNOSIS — M25562 Pain in left knee: Secondary | ICD-10-CM

## 2013-05-16 DIAGNOSIS — Z791 Long term (current) use of non-steroidal anti-inflammatories (NSAID): Secondary | ICD-10-CM | POA: Insufficient documentation

## 2013-05-16 DIAGNOSIS — Z3202 Encounter for pregnancy test, result negative: Secondary | ICD-10-CM | POA: Insufficient documentation

## 2013-05-16 DIAGNOSIS — Z79899 Other long term (current) drug therapy: Secondary | ICD-10-CM | POA: Insufficient documentation

## 2013-05-16 DIAGNOSIS — M25569 Pain in unspecified knee: Secondary | ICD-10-CM | POA: Insufficient documentation

## 2013-05-16 DIAGNOSIS — M722 Plantar fascial fibromatosis: Secondary | ICD-10-CM

## 2013-05-16 LAB — URINALYSIS, ROUTINE W REFLEX MICROSCOPIC
BILIRUBIN URINE: NEGATIVE
Glucose, UA: NEGATIVE mg/dL
Ketones, ur: NEGATIVE mg/dL
NITRITE: NEGATIVE
PH: 7 (ref 5.0–8.0)
Protein, ur: 100 mg/dL — AB
SPECIFIC GRAVITY, URINE: 1.018 (ref 1.005–1.030)
Urobilinogen, UA: 1 mg/dL (ref 0.0–1.0)

## 2013-05-16 LAB — CBC WITH DIFFERENTIAL/PLATELET
BASOS ABS: 0 10*3/uL (ref 0.0–0.1)
Basophils Relative: 0 % (ref 0–1)
EOS ABS: 0.1 10*3/uL (ref 0.0–0.7)
EOS PCT: 2 % (ref 0–5)
HCT: 36.6 % (ref 36.0–46.0)
Hemoglobin: 12.1 g/dL (ref 12.0–15.0)
Lymphocytes Relative: 42 % (ref 12–46)
Lymphs Abs: 2.2 10*3/uL (ref 0.7–4.0)
MCH: 29.4 pg (ref 26.0–34.0)
MCHC: 33.1 g/dL (ref 30.0–36.0)
MCV: 89.1 fL (ref 78.0–100.0)
Monocytes Absolute: 0.2 10*3/uL (ref 0.1–1.0)
Monocytes Relative: 3 % (ref 3–12)
Neutro Abs: 2.8 10*3/uL (ref 1.7–7.7)
Neutrophils Relative %: 53 % (ref 43–77)
PLATELETS: 137 10*3/uL — AB (ref 150–400)
RBC: 4.11 MIL/uL (ref 3.87–5.11)
RDW: 12.6 % (ref 11.5–15.5)
WBC: 5.4 10*3/uL (ref 4.0–10.5)

## 2013-05-16 LAB — COMPREHENSIVE METABOLIC PANEL
ALT: 15 U/L (ref 0–35)
AST: 18 U/L (ref 0–37)
Albumin: 3.9 g/dL (ref 3.5–5.2)
Alkaline Phosphatase: 72 U/L (ref 39–117)
BUN: 12 mg/dL (ref 6–23)
CALCIUM: 9.1 mg/dL (ref 8.4–10.5)
CO2: 22 mEq/L (ref 19–32)
Chloride: 104 mEq/L (ref 96–112)
Creatinine, Ser: 0.75 mg/dL (ref 0.50–1.10)
GFR calc non Af Amer: >90 mL/min (ref >90)
Glucose, Bld: 96 mg/dL (ref 70–99)
Potassium: 3.7 mEq/L (ref 3.7–5.3)
Sodium: 139 mEq/L (ref 137–147)
TOTAL PROTEIN: 7.6 g/dL (ref 6.0–8.3)
Total Bilirubin: <0.2 mg/dL — ABNORMAL LOW (ref 0.3–1.2)

## 2013-05-16 LAB — URINE MICROSCOPIC-ADD ON

## 2013-05-16 MED ORDER — CEPHALEXIN 500 MG PO CAPS: 500.0000 mg | ORAL_CAPSULE | Freq: Four times a day (QID) | ORAL | Status: DC

## 2013-05-16 MED ORDER — MELOXICAM 15 MG PO TABS: 15.0000 mg | ORAL_TABLET | Freq: Every day | ORAL | Status: DC

## 2013-05-16 NOTE — ED Provider Notes (Signed)
CSN: [id number]     Arrival date & time 05/16/13  1423 History   First MD Initiated Contact with Patient 05/16/13 1852     Chief Complaint  Patient presents with  . Hematuria  . Abdominal Pain     (Consider location/radiation/quality/duration/timing/severity/associated sxs/prior Treatment) HPI Comments: Patient is a 33 year old female who presents to the emergency department complaining of hematuria x2 days. Patient states she occasionally sees small amount of red blood in her urine and noticed that her urine was darker. Towards the end of urination she has a slight burning sensation along with increased urinary frequency and urgency. Admits to associated suprapubic abdominal pain occasionally radiating to the right side of her back. Denies fever, chills, nausea, vomiting or diarrhea. Also complaining of left heel and knee pain for a few months. States every time she walks she gets a pulling pain in her heel and at the bottom of her foot. She tried taking anti-inflammatories with no relief. After walking for about 20 minutes she gets severe pain in to her knee. Denies back pain or hip pain. No swelling.  Patient is a 33 y.o. female presenting with hematuria and abdominal pain. The history is provided by the patient.  Hematuria Associated symptoms include abdominal pain.  Abdominal Pain Associated symptoms: dysuria and hematuria     History reviewed. No pertinent past medical history. History reviewed. No pertinent past surgical history. Family History  Problem Relation Age of Onset  . Anesthesia problems Neg Hx   . Hypotension Neg Hx   . Malignant hyperthermia Neg Hx   . Pseudochol deficiency Neg Hx    History  Substance Use Topics  . Smoking status: Never Smoker   . Smokeless tobacco: Never Used  . Alcohol Use: No   OB History   Grav Para Term Preterm Abortions TAB SAB Ect Mult Living   3 3 3       3      Review of Systems  Gastrointestinal: Positive for abdominal pain.   Genitourinary: Positive for dysuria, urgency, frequency, hematuria and flank pain.  Musculoskeletal:       Positive for left knee and heel pain.  All other systems reviewed and are negative.     Allergies  Review of patient's allergies indicates no known allergies.  Home Medications   Prior to Admission medications   Medication Sig Start Date End Date Taking? Authorizing Provider  diclofenac (VOLTAREN) 50 MG EC tablet Take 1 tablet (50 mg total) by mouth 2 (two) times daily as needed. 05/04/13   Rodolph BongEvan S Corey, MD  ondansetron (ZOFRAN) 8 MG tablet Take 1 tablet (8 mg total) by mouth every 8 (eight) hours as needed for nausea or vomiting. 05/04/13   Rodolph BongEvan S Corey, MD   BP 119/80  Pulse 66  Temp(Src) 98.2 F (36.8 C) (Oral)  Resp 20  Ht 5\' 2"  (1.575 m)  Wt 158 lb (71.668 kg)  BMI 28.89 kg/m2  SpO2 100%  LMP 04/30/2013 Physical Exam  Nursing note and vitals reviewed. Constitutional: She is oriented to person, place, and time. She appears well-developed and well-nourished. No distress.  HENT:  Head: Normocephalic and atraumatic.  Mouth/Throat: Oropharynx is clear and moist.  Eyes: Conjunctivae are normal.  Neck: Normal range of motion. Neck supple.  Cardiovascular: Normal rate, regular rhythm, normal heart sounds and intact distal pulses.   Pulmonary/Chest: Effort normal and breath sounds normal.  Abdominal: Soft. Normal appearance and bowel sounds are normal. There is tenderness in the suprapubic area. There  is no rigidity, no rebound, no guarding and no CVA tenderness.  No peritoneal signs.  Musculoskeletal: Normal range of motion. She exhibits no edema.       Lumbar back: Normal.  Tender to palpation medial and lateral joint line of left knee. Full range of motion. No swelling. Normal gait. Left heel nontender. Mild tenderness to the plantar fascia area. Full range of motion of left foot and ankle.  Neurological: She is alert and oriented to person, place, and time.  Skin:  Skin is warm and dry. She is not diaphoretic.  Psychiatric: She has a normal mood and affect. Her behavior is normal.    ED Course  Procedures (including critical care time) Labs Review Labs Reviewed  CBC WITH DIFFERENTIAL - Abnormal; Notable for the following:    Platelets 137 (*)    All other components within normal limits  COMPREHENSIVE METABOLIC PANEL - Abnormal; Notable for the following:    Total Bilirubin <0.2 (*)    All other components within normal limits  URINALYSIS, ROUTINE W REFLEX MICROSCOPIC - Abnormal; Notable for the following:    APPearance CLOUDY (*)    Hgb urine dipstick LARGE (*)    Protein, ur 100 (*)    Leukocytes, UA LARGE (*)    All other components within normal limits  URINE MICROSCOPIC-ADD ON - Abnormal; Notable for the following:    Bacteria, UA MANY (*)    All other components within normal limits  POC URINE PREG, ED    Imaging Review No results found.   EKG Interpretation None      MDM   Final diagnoses:  UTI (urinary tract infection)  Left knee pain  Plantar fasciitis    Patient presenting with urinary symptoms along with knee and heel pain. She is well appearing and in no apparent distress. Labs obtained in triage prior to patient being seen, urine is positive for infection. We'll treat with Keflex. No CVA tenderness. Afebrile, vital signs stable. Symptoms consistent with plantar fasciitis regarding heel pain. Will obtain x-ray of left knee. Patient discussed with Francetta FoundKaitlin Palmer, PA-C at shift change who will take over care of patient at this time. Plan to discharge with Mobic for knee pain, discussed stretches for plantar fasciitis.   Trevor MaceRobyn M Albert, PA-C 05/16/13 2000

## 2013-05-16 NOTE — Discharge Instructions (Signed)
Take antibiotic to completion for your urinary tract infection. Followup with the wellness clinic to establish care with a primary care physician. Rest, ice and elevate your knee. Stretch out your heel. Take mobic as directed for knee pain.  Urinary Tract Infection Urinary tract infections (UTIs) can develop anywhere along your urinary tract. Your urinary tract is your body's drainage system for removing wastes and extra water. Your urinary tract includes two kidneys, two ureters, a bladder, and a urethra. Your kidneys are a pair of bean-shaped organs. Each kidney is about the size of your fist. They are located below your ribs, one on each side of your spine. CAUSES Infections are caused by microbes, which are microscopic organisms, including fungi, viruses, and bacteria. These organisms are so small that they can only be seen through a microscope. Bacteria are the microbes that most commonly cause UTIs. SYMPTOMS  Symptoms of UTIs may vary by age and gender of the patient and by the location of the infection. Symptoms in young women typically include a frequent and intense urge to urinate and a painful, burning feeling in the bladder or urethra during urination. Older women and men are more likely to be tired, shaky, and weak and have muscle aches and abdominal pain. A fever may mean the infection is in your kidneys. Other symptoms of a kidney infection include pain in your back or sides below the ribs, nausea, and vomiting. DIAGNOSIS To diagnose a UTI, your caregiver will ask you about your symptoms. Your caregiver also will ask to provide a urine sample. The urine sample will be tested for bacteria and white blood cells. White blood cells are made by your body to help fight infection. TREATMENT  Typically, UTIs can be treated with medication. Because most UTIs are caused by a bacterial infection, they usually can be treated with the use of antibiotics. The choice of antibiotic and length of treatment  depend on your symptoms and the type of bacteria causing your infection. HOME CARE INSTRUCTIONS  If you were prescribed antibiotics, take them exactly as your caregiver instructs you. Finish the medication even if you feel better after you have only taken some of the medication.  Drink enough water and fluids to keep your urine clear or pale yellow.  Avoid caffeine, tea, and carbonated beverages. They tend to irritate your bladder.  Empty your bladder often. Avoid holding urine for long periods of time.  Empty your bladder before and after sexual intercourse.  After a bowel movement, women should cleanse from front to back. Use each tissue only once. SEEK MEDICAL CARE IF:   You have back pain.  You develop a fever.  Your symptoms do not begin to resolve within 3 days. SEEK IMMEDIATE MEDICAL CARE IF:   You have severe back pain or lower abdominal pain.  You develop chills.  You have nausea or vomiting.  You have continued burning or discomfort with urination. MAKE SURE YOU:   Understand these instructions.  Will watch your condition.  Will get help right away if you are not doing well or get worse. Document Released: 10/13/2004 Document Revised: 07/05/2011 Document Reviewed: 02/11/2011 [facility] Patient Information 2014 [place], [place].  Plantar Fasciitis Plantar fasciitis is a common condition that causes foot pain. It is soreness (inflammation) of the band of tough fibrous tissue on the bottom of the foot that runs from the heel bone (calcaneus) to the ball of the foot. The cause of this soreness may be from excessive standing, poor fitting shoes,  running on hard surfaces, being overweight, having an abnormal walk, or overuse (this is common in runners) of the painful foot or feet. It is also common in aerobic exercise dancers and ballet dancers. SYMPTOMS  Most people with plantar fasciitis complain of:  Severe pain in the morning on the bottom of their foot especially  when taking the first steps out of bed. This pain recedes after a few minutes of walking.  Severe pain is experienced also during walking following a long period of inactivity.  Pain is worse when walking barefoot or up stairs DIAGNOSIS   Your caregiver will diagnose this condition by examining and feeling your foot.  Special tests such as X-rays of your foot, are usually not needed. PREVENTION   Consult a sports medicine professional before beginning a new exercise program.  Walking programs offer a good workout. With walking there is a lower chance of overuse injuries common to runners. There is less impact and less jarring of the joints.  Begin all new exercise programs slowly. If problems or pain develop, decrease the amount of time or distance until you are at a comfortable level.  Wear good shoes and replace them regularly.  Stretch your foot and the heel cords at the back of the ankle (Achilles tendon) both before and after exercise.  Run or exercise on even surfaces that are not hard. For example, asphalt is better than pavement.  Do not run barefoot on hard surfaces.  If using a treadmill, vary the incline.  Do not continue to workout if you have foot or joint problems. Seek professional help if they do not improve. HOME CARE INSTRUCTIONS   Avoid activities that cause you pain until you recover.  Use ice or cold packs on the problem or painful areas after working out.  Only take over-the-counter or prescription medicines for pain, discomfort, or fever as directed by your caregiver.  Soft shoe inserts or athletic shoes with air or gel sole cushions may be helpful.  If problems continue or become more severe, consult a sports medicine caregiver or your own health care provider. Cortisone is a potent anti-inflammatory medication that may be injected into the painful area. You can discuss this treatment with your caregiver. MAKE SURE YOU:   Understand these  instructions.  Will watch your condition.  Will get help right away if you are not doing well or get worse. Document Released: 09/28/2000 Document Revised: 03/28/2011 Document Reviewed: 11/28/2007 [facility] Patient Information 2014 [place], [place].  Knee Pain Knee pain can be a result of an injury or other medical conditions. Treatment will depend on the cause of your pain. HOME CARE  Only take medicine as told by your doctor.  Keep a healthy weight. Being overweight can make the knee hurt more.  Stretch before exercising or playing sports.  If there is constant knee pain, change the way you exercise. Ask your doctor for advice.  Make sure shoes fit well. Choose the right shoe for the sport or activity.  Protect your knees. Wear kneepads if needed.  Rest when you are tired. GET HELP RIGHT AWAY IF:   Your knee pain does not stop.  Your knee pain does not get better.  Your knee joint feels hot to the touch.  You have a fever. MAKE SURE YOU:   Understand these instructions.  Will watch this condition.  Will get help right away if you are not doing well or get worse. Document Released: 04/01/2008 Document Revised: 03/28/2011 Document Reviewed:  04/01/2008 ExitCare Patient Information 2014 [place], [place].  Thrombocytopenia Thrombocytopenia is a condition in which there is an abnormally small number of platelets in your blood. Platelets are also called thrombocytes. Platelets are needed for blood clotting. CAUSES Thrombocytopenia is caused by:   Decreased production of platelets. This can be caused by:  Aplastic anemia in which your bone marrow quits making blood cells.  Cancer in the bone marrow.  Use of certain medicines, including chemotherapy.  Infection in the bone marrow.  Heavy alcohol consumption.  Increased destruction of platelets. This can be caused by:  Certain immune diseases.  Use of certain drugs.  Certain blood clotting disorders.  Certain  inherited disorders.  Certain bleeding disorders.  Pregnancy.  Having an enlarged spleen (hypersplenism). In hypersplenism, the spleen gathers up platelets from circulation. This means the platelets are not available to help with blood clotting. The spleen can enlarge due to cirrhosis or other conditions. SYMPTOMS  The symptoms of thrombocytopenia are side effects of poor blood clotting. Some of these are:  Abnormal bleeding.  Nosebleeds.  Heavy menstrual periods.  Blood in the urine or stools.  Purpura. This is a purplish discoloration in the skin produced by small bleeding vessels near the surface of the skin.  Bruising.  A rash that may be petechial. This looks like pinpoint, purplish-red spots on the skin and mucous membranes. It is caused by bleeding from small blood vessels (capillaries). DIAGNOSIS  Your caregiver will make this diagnosis based on your exam and blood tests. Sometimes, a bone marrow study is done to look for the original cells (megakaryocytes) that make platelets. TREATMENT  Treatment depends on the cause of the condition.  Medicines may be given to help protect your platelets from being destroyed.  In some cases, a replacement (transfusion) of platelets may be required to stop or prevent bleeding.  Sometimes, the spleen must be surgically removed. HOME CARE INSTRUCTIONS   Check the skin and linings inside your mouth for bruising or bleeding as directed by your caregiver.  Check your sputum, urine, and stool for blood as directed by your caregiver.  Do not return to any activities that could cause bumps or bruises until your caregiver says it is okay.  Take extra care not to cut yourself when shaving or when using scissors, needles, knives, and other tools.  Take extra care not to burn yourself when ironing or cooking.  Ask your caregiver if it is okay for you to drink alcohol.  Only take over-the-counter or prescription medicines as directed by  your caregiver.  Notify all your caregivers, including dentists and eye doctors, about your condition. SEEK IMMEDIATE MEDICAL CARE IF:   You develop active bleeding from anywhere in your body.  You develop unexplained bruising or bleeding.  You have blood in your sputum, urine, or stool. MAKE SURE YOU:  Understand these instructions.  Will watch your condition.  Will get help right away if you are not doing well or get worse. Document Released: 01/03/2005 Document Revised: 03/28/2011 Document Reviewed: 11/05/2010 [facility] Patient Information 2014 [place], [place].   Emergency Department Resource Guide 1) Find a Doctor and Pay Out of Pocket Although you won't have to find out who is covered by your insurance plan, it is a good idea to ask around and get recommendations. You will then need to call the office and see if the doctor you have chosen will accept you as a new patient and what types of options they offer for patients who are  self-pay. Some doctors offer discounts or will set up payment plans for their patients who do not have insurance, but you will need to ask so you aren't surprised when you get to your appointment.  2) Contact Your Local Health Department Not all health departments have doctors that can see patients for sick visits, but many do, so it is worth a call to see if yours does. If you don't know where your local health department is, you can check in your phone book. The CDC also has a tool to help you locate your state's health department, and many state websites also have listings of all of their local health departments.  3) Find a Walk-in Clinic If your illness is not likely to be very severe or complicated, you may want to try a walk in clinic. These are popping up all over the country in pharmacies, drugstores, and shopping centers. They're usually staffed by nurse practitioners or physician assistants that have been trained to treat common illnesses and  complaints. They're usually fairly quick and inexpensive. However, if you have serious medical issues or chronic medical problems, these are probably not your best option.  No Primary Care Doctor: - Call Health Connect at  [phone] - they can help you locate a primary care doctor that  accepts your insurance, provides certain services, etc. - Physician Referral Service- [phone]  Chronic Pain Problems: Organization         Address  Phone   Notes  [facility]  [phone] Patients need to be referred by their primary care doctor.   Medication Assistance: Organization         Address  Phone   Notes  [facility] Medication [facility] [address] [place], [place] [address] [phone] --Must be a resident of [facility] -- Must have NO insurance coverage whatsoever (no Medicaid/ Medicare, etc.) -- The pt. MUST have a primary care doctor that directs their care regularly and follows them in the community   [facility]ist  [phone]   [employer]  [phone]    Agencies that provide inexpensive medical care: Organization         Address  Phone   Notes  [facility]  [phone]   [facility]    [phone]   [facility] [address] [place], [place] [address] [phone]   [facility] [address], [place] [phone]   Planned Parenthood    [phone]   [facility]    [phone]   [facility]  [address] [place] [address], [place] Phone:  [phone], Fax:  [phone] Hours of Operation:  9 am - 6 pm, M-F.  Also accepts Medicaid/Medicare and self-pay.  [facility]  [address], [place] Phone: [phone], Fax: [phone]. Hours of Operation:  8:30 am - 5:30 pm, M-F.  Also accepts Medicaid and self-pay.  [facility] [place] [address], [place] Phone: [phone]   [facility] [address] Natasha Bence [place], [place] [phone], Ext. [phone] Mondays & Thursdays: 7-9 AM.  First 15 patients are seen on a first come, first serve basis.    Medicaid-accepting [facility] Providers:  Organization         Address  Phone   Notes  [facility] [address], [address], [place] [phone] Also accepts self-pay patients.  [facility] [address] [place], [place]  [phone]) [phone]   [facility] [address], [place] [phone]) [phone]   [facility] [address] [place] [address], [place] [phone]) [phone]   Renaye RakersVeita Bland [address], [place]   [phone] Only accepts [place] Access [place] patients after they have their name applied to their card.   Self-Pay (no insurance) in [facility]:  Organization         Address  Phone   Notes  Sickle Cell Patients, [facility] [address], [place] [phone]) [phone]   [facility] [address], [place] [phone]) [phone]   [facility] [facility]  [address], [place] [phone]) [phone]   [facility]/Dr. Osei-Bonsu  [address] [place] [address], [place] or [phone], [place] [phone] Phone number for both [place] and [place] locations is the same.  [facility] [address], [place] [phone]) [phone]   [facility] [address] [place] [address], [place] or [address] [phone]) [phone]   [facility] [address], [place] [phone](336) [phone], phone; [phone]) [phone], fax Sees patients 1st and 3rd Saturday of every month.  Must not qualify for public or private insurance (i.e. Medicaid, Medicare, [place] Health Choice, Veterans' Benefits)  Household income should be no more than 200% of the poverty level  The clinic cannot treat you if you are pregnant or think you are pregnant  Sexually transmitted diseases are not treated at the clinic.    Dental Care: Organization         Address  Phone  Notes  [facility] [address], [place] [phone] Accepts children up to age 33 who are enrolled in [place] or [place] Health Choice; pregnant women with a Medicaid card; and children who have applied for Medicaid or [place] Health Choice, but were declined, whose parents can pay a reduced fee at time of service.  [facility] [place]  [address], [place] [phone]) [phone] Accepts children up to age 33 who are enrolled in [place] or [place] Health Choice; pregnant women with a Medicaid card; and children who have applied for Medicaid or [place] Health Choice, but were declined, whose parents can pay a reduced fee at time of service.  [facility]  [address], [place] [phone] Patients are seen by appointment only. Walk-ins are not accepted. [facility] will see patients 33 years of age and older. Monday - Tuesday (8am-5pm) Most Wednesdays (8:30-5pm) $30 per visit, cash only  [facility]  [address], [facility] [place] [phone] Patients are seen by appointment only. Walk-ins are not accepted. [facility] will see patients 33 years of age and older. One Wednesday Evening (Monthly: Volunteer Based).  $30 per visit, cash only  [employer] [facility]  [phone] for adults; Children under age 514, call Graduate Pediatric Dentistry at [phone]. Children aged 254-14, please call [phone] to request a pediatric application.  Dental services are provided in all areas of dental care including fillings, crowns and bridges, complete and partial dentures, implants, gum treatment, root canals, and extractions.  Preventive care is  also provided. Treatment is provided to both adults and children. Patients are selected via a lottery and there is often a waiting list.   [facility] [address], [place]  [phone] [email or web address]   [facility] [address] [place], [place] [phone], Ext. [phone] Second and Fourth Thursday of each month, opens at 6:30 AM; Clinic ends at 9 AM.  Patients are seen on a first-come first-served basis, and a limited number are seen during each clinic.   [facility]  [address] [place] [place], [place] [phone]   Eligibility Requirements You must have lived in [place], [place], or [place] counties for at least the last three months.   You cannot be eligible for state or federal sponsored [place], including [employer], [place], or [employer].   You generally cannot be eligible for healthcare insurance through your employer.    How to apply: Eligibility screenings are held every Tuesday and Wednesday afternoon from 1:00 pm until 4:00 pm. You do not need an appointment for the interview!  [facility] [address] [place], [place] [phone]   [facility] Health Department  [phone]   [facility] Health Department  [phone]   [facility] Health Department  [phone]    Behavioral Health Resources in the Community: Intensive Outpatient Programs Organization         Address  Phone  Notes  [facility] [address] [place], [place] [phone]   [facility] [address] [place], [place] [phone]   [facility] [address] [place], [place]  [phone]   [facility] Mental Health [address],  [place], [place] [phone] or [phone]   Substance Abuse Resources Organization         Address  Phone  Notes  Al[facility] Drug Services  [phone]   [facility]  [phone]   [facility] [place]  [phone]   Floydene Flock  [phone]   Residential & Outpatient Substance Abuse Program  [phone]   Psychological Services Organization         Address  Phone  Notes  [facility]  [phone]   [facility]  [phone]   [facility] Mental Health [address] [place] [phone] or [phone]    Mobile Crisis Teams Organization         Address  Phone  Notes  [facility], Mobile Crisis Care Unit  [phone]   [facility]  [address] [place], [place] [phone]   Doristine Locks [address] [place] [phone]    Self-Help/Support Groups Organization         Address  Phone             Notes  [facility] [place] - variety of support groups  [phone]- I7437963 Call for more information  Narcotics Anonymous (NA), [facility] [address], [employer] [place]  2 meetings at this location   Statistician         Address  Phone  Notes  [facility] [address],    [place]  [phone]   [facility]  [address] [place] [address], [place], [place] [phone]   [facility] [address] [place], [place] [phone] Admissions: 8am-3pm M-F  [facility] [address]    [place], [place] [phone]   [facility] [address] [place], [place], [place] [phone]   [facility] [address],  [place], [place] [phone]   [facility] - Intensive Outpatient [address], [place], [place] [phone]   [facility] ([facility] [address]  [place], [place] [phone] or [phone]   [facility] ([facility]) [address] [place], [place] [phone] Accepts Medicaid  [facility] [address]  [place] [phone]  Substance Abuse/Addiction Treatment   [facility] Organization         Address  Phone  Notes  [facility]  [phone]   Angie Fava, PhD [address] Ervin Knack [place], [place]   [phone] or [phone]   [facility] Behavioral   [address] [place], [place] [phone]   [facility] [address], [place], [place] [phone] Insurance/Medicaid/sponsorship through [facility] and Families [address]                                    [place], [place] [phone] Therapy/tele-psych/case  [facility] [address], [place] [id number]    Dr. Lolly Mustache  [phone]   [facility]  [employer] [facility] Dept. 1) [address] [place] 2) [address], [place] 3)  [address], [place] [phone]  [phone]   [facility] Child Abuse Hotline [phone] or [phone] (After Hours)

## 2013-05-16 NOTE — ED Notes (Addendum)
Blood in urine Tuesday  But none today  States hurts to void and having rt sided abd pain also having left heel pain and knee pain no injury

## 2013-05-16 NOTE — ED Provider Notes (Signed)
8:00 PM = [facility]. Await results of knee x-ray and discharge home for UTI with antibiotics.   Results for orders placed during the Webb encounter of 05/16/13  CBC WITH DIFFERENTIAL      Result Value Ref Range   WBC 5.4  4.0 - 10.5 K/uL   RBC 4.11  3.87 - 5.11 MIL/uL   Hemoglobin 12.1  12.0 - 15.0 g/dL   HCT 38.736.6  56.436.0 - 33.246.0 %   MCV 89.1  78.0 - 100.0 fL   MCH 29.4  26.0 - 34.0 pg   MCHC 33.1  30.0 - 36.0 g/dL   RDW 95.112.6  88.411.5 - 16.615.5 %   Platelets 137 (*) 150 - 400 K/uL   Neutrophils Relative % 53  43 - 77 %   Neutro Abs 2.8  1.7 - 7.7 K/uL   Lymphocytes Relative 42  12 - 46 %   Lymphs Abs 2.2  0.7 - 4.0 K/uL   Monocytes Relative 3  3 - 12 %   Monocytes Absolute 0.2  0.1 - 1.0 K/uL   Eosinophils Relative 2  0 - 5 %   Eosinophils Absolute 0.1  0.0 - 0.7 K/uL   Basophils Relative 0  0 - 1 %   Basophils Absolute 0.0  0.0 - 0.1 K/uL  COMPREHENSIVE METABOLIC PANEL      Result Value Ref Range   Sodium 139  137 - 147 mEq/L   Potassium 3.7  3.7 - 5.3 mEq/L   Chloride 104  96 - 112 mEq/L   CO2 22  19 - 32 mEq/L   Glucose, Bld 96  70 - 99 mg/dL   BUN 12  6 - 23 mg/dL   Creatinine, Ser 0.630.75  0.50 - 1.10 mg/dL   Calcium 9.1  8.4 - 01.610.5 mg/dL   Total Protein 7.6  6.0 - 8.3 g/dL   Albumin 3.9  3.5 - 5.2 g/dL   AST 18  0 - 37 U/L   ALT 15  0 - 35 U/L   Alkaline Phosphatase 72  39 - 117 U/L   Total Bilirubin <0.2 (*) 0.3 - 1.2 mg/dL   GFR calc non Af Amer >90  >90 mL/min   GFR calc Af Amer >90  >90 mL/min  URINALYSIS, ROUTINE W REFLEX MICROSCOPIC      Result Value Ref Range   Color, Urine YELLOW  YELLOW   APPearance CLOUDY (*) CLEAR   Specific Gravity, Urine 1.018  1.005 - 1.030   pH 7.0  5.0 - 8.0   Glucose, UA NEGATIVE  NEGATIVE mg/dL   Hgb urine dipstick LARGE (*) NEGATIVE   Bilirubin Urine NEGATIVE  NEGATIVE   Ketones, ur NEGATIVE  NEGATIVE mg/dL   Protein, ur 010100 (*) NEGATIVE mg/dL   Urobilinogen, UA 1.0  0.0 - 1.0 mg/dL   Nitrite  NEGATIVE  NEGATIVE   Leukocytes, UA LARGE (*) NEGATIVE  URINE MICROSCOPIC-ADD ON      Result Value Ref Range   Squamous Epithelial / LPF RARE  RARE   WBC, UA TOO NUMEROUS TO COUNT  <3 WBC/hpf   RBC / HPF 7-10  <3 RBC/hpf   Bacteria, UA MANY (*) RARE   Filed Vitals:   05/16/13 1447 05/16/13 1827 05/16/13 2000 05/16/13 2015  BP: 114/76 119/80 118/78 126/82  Pulse: 84 66 69 74  Temp: 98.4 F (36.9 C) 98.2 F (36.8 C)    TempSrc: Oral Oral    Resp: 18 20  Height: 5\' 2"  (1.575 m)     Weight: 158 lb (71.668 kg)     SpO2: 100% 100% 100% 100%    DG Tibia/Fibula Left (Final result)  Result time: 05/16/13 21:17:50    Final result by Rad Results In Interface (05/16/13 21:17:50)    Narrative:   CLINICAL DATA: HEMATURIA ABDOMINAL PAIN  EXAM: LEFT TIBIA AND FIBULA - 2 VIEW  COMPARISON: None.  FINDINGS: There is no evidence of fracture or other focal bone lesions. Soft tissues are unremarkable.  IMPRESSION: Negative.   Electronically Signed By: Katelyn HolmesHector Webb M.D. On: 05/16/2013 21:17             DG Knee Complete 4 Views Left (Final result)  Result time: 05/16/13 20:55:21    Final result by Rad Results In Interface (05/16/13 20:55:21)    Narrative:   CLINICAL DATA: Left lower extremity swelling for 3 months with generalized knee pain.  EXAM: LEFT KNEE - COMPLETE 4+ VIEW  COMPARISON: None.  FINDINGS: Four views of the left knee reveal the bones to be reasonably well mineralized for age. There is no evidence of an acute fracture or dislocation. There is no periosteal reaction or lytic or blastic lesion. The joint spaces are reasonably well-maintained with a nonweightbearing technique. One cannot exclude a joint effusion. There is mild soft tissue swelling diffusely.  IMPRESSION: No acute or chronic bony abnormality of the left knee is demonstrated.   Electronically Signed By: Katelyn SwazilandJordan On: 05/16/2013 20:55     Katelyn Webb is a 33 y.o. female  with no PMH who presents to the ED for evaluation of multiple complaints.   Rechecks  9:40 PM = Patient ambulating around the room without difficulty. Abdominal exam benign. Patient complains of left knee and shin pain. No injuries or trauma. Diffuse tenderness to palpation to the knee throughout. No joint effusion or erythema. No calf tenderness or edema. Dorsalis pedis pulses present and equal bilaterally. Patient also complains of pain to the bottom of her left foot which is worse with "walking long distances." No wounds or trauma. Patient has non-supportive flats. Instructed patient to get more supportive shoes with arch support.    Patient found to have a UTI. Will treat with keflex. Urine sent for culture. Also has left knee and foot pain. X-rays negative for fracture or malalignment. Patient neurovascularly intact. Foot pain possibly due to plantar fasciitis. RICE method discussed with patient. Instructed patient to follow-up with PCP. Patient incidentally found to have thrombocytopenia (137) which has been present in the past. Patient informed of this and instructed to follow-up. Return precautions, discharge instructions, and follow-up was discussed with the patient before discharge.     New Prescriptions   CEPHALEXIN (KEFLEX) 500 MG CAPSULE    Take 1 capsule (500 mg total) by mouth 4 (four) times daily.   MELOXICAM (MOBIC) 15 MG TABLET    Take 1 tablet (15 mg total) by mouth daily.     Final impressions: 1. UTI (urinary tract infection)   2. Left knee pain   3. Plantar fasciitis   4. Thrombocytopenia      Katelyn EeJessica Katlin Gaylen Venning PA-C         Katelyn LedgerJessica K Demarious Webb, [place] JerseyPA-C 05/16/13 [phone]

## 2013-05-18 LAB — URINE CULTURE

## 2013-05-18 NOTE — ED Provider Notes (Signed)
Medical screening examination/treatment/procedure(s) were performed by non-physician practitioner and as supervising physician I was immediately available for consultation/collaboration.   EKG Interpretation None        Jonae Renshaw B. Bernette MayersSheldon, MD 05/18/13 [phone]

## 2013-05-19 ENCOUNTER — Telehealth (HOSPITAL_BASED_OUTPATIENT_CLINIC_OR_DEPARTMENT_OTHER): Payer: Self-pay | Admitting: Emergency Medicine

## 2013-05-19 NOTE — Telephone Encounter (Signed)
Post ED Visit - Positive Culture Follow-up  Culture report reviewed by antimicrobial stewardship pharmacist: [] Wes Dulaney, Pharm.D., BCPS [] Jeremy Frens, Pharm.D., BCPS [] Elizabeth Martin, Pharm.D., BCPS [x] Minh Pham, Pharm.D., BCPS, AAHIVP [] Michelle Turner, Pharm.D., BCPS, AAHIVP [] Nathan Cope, Pharm.D.  Positive urine culture Treated with Keflex, organism sensitive to the same and no further patient follow-up is required at this time.  Katelyn Webb 05/19/2013, 6:08 PM   

## 2013-05-20 NOTE — ED Provider Notes (Signed)
Medical screening examination/treatment/procedure(s) were performed by non-physician practitioner and as supervising physician I was immediately available for consultation/collaboration.   EKG Interpretation None        Rolland PorterMark Tyshawna Alarid, MD 05/20/13 [phone]

## 2013-06-02 ENCOUNTER — Emergency Department (HOSPITAL_COMMUNITY)
Admission: EM | Admit: 2013-06-02 | Discharge: 2013-06-02 | Disposition: A | Discharge status: Home / Self Care | Payer: 59 | Attending: Emergency Medicine | Admitting: Emergency Medicine

## 2013-06-02 ENCOUNTER — Emergency Department (HOSPITAL_COMMUNITY): Payer: 59

## 2013-06-02 ENCOUNTER — Encounter (HOSPITAL_COMMUNITY): Payer: Self-pay | Admitting: Emergency Medicine

## 2013-06-02 DIAGNOSIS — R5381 Other malaise: Secondary | ICD-10-CM | POA: Insufficient documentation

## 2013-06-02 DIAGNOSIS — R5383 Other fatigue: Secondary | ICD-10-CM

## 2013-06-02 DIAGNOSIS — R51 Headache: Secondary | ICD-10-CM | POA: Insufficient documentation

## 2013-06-02 DIAGNOSIS — Z791 Long term (current) use of non-steroidal anti-inflammatories (NSAID): Secondary | ICD-10-CM | POA: Insufficient documentation

## 2013-06-02 DIAGNOSIS — N12 Tubulo-interstitial nephritis, not specified as acute or chronic: Secondary | ICD-10-CM | POA: Insufficient documentation

## 2013-06-02 DIAGNOSIS — Z3202 Encounter for pregnancy test, result negative: Secondary | ICD-10-CM | POA: Insufficient documentation

## 2013-06-02 LAB — URINALYSIS, ROUTINE W REFLEX MICROSCOPIC
BILIRUBIN URINE: NEGATIVE
Glucose, UA: NEGATIVE mg/dL
Hgb urine dipstick: NEGATIVE
Ketones, ur: 15 mg/dL — AB
NITRITE: NEGATIVE
PROTEIN: 30 mg/dL — AB
Specific Gravity, Urine: 1.018 (ref 1.005–1.030)
Urobilinogen, UA: 1 mg/dL (ref 0.0–1.0)
pH: 8.5 — ABNORMAL HIGH (ref 5.0–8.0)

## 2013-06-02 LAB — COMPREHENSIVE METABOLIC PANEL
ALK PHOS: 90 U/L (ref 39–117)
ALT: 19 U/L (ref 0–35)
AST: 20 U/L (ref 0–37)
Albumin: 3.7 g/dL (ref 3.5–5.2)
BILIRUBIN TOTAL: 0.6 mg/dL (ref 0.3–1.2)
BUN: 12 mg/dL (ref 6–23)
CO2: 23 mEq/L (ref 19–32)
Calcium: 9.2 mg/dL (ref 8.4–10.5)
Chloride: 99 mEq/L (ref 96–112)
Creatinine, Ser: 0.76 mg/dL (ref 0.50–1.10)
GFR calc non Af Amer: >90 mL/min (ref >90)
GLUCOSE: 92 mg/dL (ref 70–99)
POTASSIUM: 3.3 meq/L — AB (ref 3.7–5.3)
Sodium: 138 mEq/L (ref 137–147)
Total Protein: 7.3 g/dL (ref 6.0–8.3)

## 2013-06-02 LAB — CBC WITH DIFFERENTIAL/PLATELET
Basophils Absolute: 0 10*3/uL (ref 0.0–0.1)
Basophils Relative: 0 % (ref 0–1)
EOS ABS: 0 10*3/uL (ref 0.0–0.7)
Eosinophils Relative: 0 % (ref 0–5)
HCT: 33.8 % — ABNORMAL LOW (ref 36.0–46.0)
HEMOGLOBIN: 11.4 g/dL — AB (ref 12.0–15.0)
Lymphocytes Relative: 12 % (ref 12–46)
Lymphs Abs: 1 10*3/uL (ref 0.7–4.0)
MCH: 29.2 pg (ref 26.0–34.0)
MCHC: 33.7 g/dL (ref 30.0–36.0)
MCV: 86.7 fL (ref 78.0–100.0)
MONOS PCT: 7 % (ref 3–12)
Monocytes Absolute: 0.5 10*3/uL (ref 0.1–1.0)
NEUTROS ABS: 6.6 10*3/uL (ref 1.7–7.7)
Neutrophils Relative %: 81 % — ABNORMAL HIGH (ref 43–77)
Platelets: 148 10*3/uL — ABNORMAL LOW (ref 150–400)
RBC: 3.9 MIL/uL (ref 3.87–5.11)
RDW: 12.4 % (ref 11.5–15.5)
WBC: 8.1 10*3/uL (ref 4.0–10.5)

## 2013-06-02 LAB — URINE MICROSCOPIC-ADD ON

## 2013-06-02 LAB — POC URINE PREG, ED: Preg Test, Ur: NEGATIVE

## 2013-06-02 LAB — LIPASE, BLOOD: Lipase: 55 U/L (ref 11–59)

## 2013-06-02 MED ORDER — ONDANSETRON HCL 4 MG/2ML IJ SOLN
4.0000 mg | Freq: Once | INTRAMUSCULAR | Status: AC
  Administered 2013-06-02: 4 mg via INTRAVENOUS
  Filled 2013-06-02: qty 2

## 2013-06-02 MED ORDER — SODIUM CHLORIDE 0.9 % IV BOLUS (SEPSIS)
1000.0000 mL | Freq: Once | INTRAVENOUS | Status: AC
  Administered 2013-06-02: 1000 mL via INTRAVENOUS

## 2013-06-02 MED ORDER — HYDROCODONE-ACETAMINOPHEN 5-325 MG PO TABS: 2.0000 | ORAL_TABLET | ORAL | Status: DC | PRN

## 2013-06-02 MED ORDER — ONDANSETRON HCL 4 MG PO TABS: 4.0000 mg | ORAL_TABLET | Freq: Four times a day (QID) | ORAL | Status: DC

## 2013-06-02 MED ORDER — CIPROFLOXACIN HCL 500 MG PO TABS: 500.0000 mg | ORAL_TABLET | Freq: Two times a day (BID) | ORAL | Status: DC

## 2013-06-02 MED ORDER — KETOROLAC TROMETHAMINE 30 MG/ML IJ SOLN
30.0000 mg | Freq: Once | INTRAMUSCULAR | Status: AC
  Administered 2013-06-02: 30 mg via INTRAVENOUS
  Filled 2013-06-02: qty 1

## 2013-06-02 MED ORDER — CIPROFLOXACIN IN D5W 400 MG/200ML IV SOLN
400.0000 mg | Freq: Once | INTRAVENOUS | Status: AC
  Administered 2013-06-02: 400 mg via INTRAVENOUS
  Filled 2013-06-02: qty 200

## 2013-06-02 NOTE — ED Notes (Signed)
Patient transported to CT 

## 2013-06-02 NOTE — ED Notes (Signed)
Patient returned from CT

## 2013-06-02 NOTE — Discharge Instructions (Signed)

## 2013-06-02 NOTE — ED Notes (Addendum)
Reports flu like symptoms since yesterday. Having headache, fever, bodyaches and n/v since yesterday. Reports being seen here recently for UTI and is taking antibiotics, having back pain and frequent urination.

## 2013-06-02 NOTE — ED Provider Notes (Signed)
CSN: [id number]     Arrival date & time 06/02/13  1045 History   First MD Initiated Contact with Patient 06/02/13 1117     Chief Complaint  Patient presents with  . Fever  . Emesis     (Consider location/radiation/quality/duration/timing/severity/associated sxs/prior Treatment) HPI Comments: Patient presents to the ER for flulike symptoms which began yesterday. Patient reports onset of fever, chills, generalized weakness, myalgias, headache. Symptoms began yesterday and continued today. She has developed nausea and vomiting, has vomited 3 times. No diarrhea or constipation. She reports an intermittent sharp crampy pain that comes and goes. Is currently not present. She is currently being treated for urinary tract infection. Still having discomfort in her low back and urinary frequency.  Patient is a 33 y.o. female presenting with fever and vomiting.  Fever Associated symptoms: chills, myalgias, nausea and vomiting   Associated symptoms: no diarrhea   Emesis Associated symptoms: abdominal pain, chills and myalgias   Associated symptoms: no diarrhea     History reviewed. No pertinent past medical history. History reviewed. No pertinent past surgical history. Family History  Problem Relation Age of Onset  . Anesthesia problems Neg Hx   . Hypotension Neg Hx   . Malignant hyperthermia Neg Hx   . Pseudochol deficiency Neg Hx    History  Substance Use Topics  . Smoking status: Never Smoker   . Smokeless tobacco: Never Used  . Alcohol Use: No   OB History   Grav Para Term Preterm Abortions TAB SAB Ect Mult Living   3 3 3       3      Review of Systems  Constitutional: Positive for fever, chills and fatigue.  Gastrointestinal: Positive for nausea, vomiting and abdominal pain. Negative for diarrhea.  Genitourinary: Positive for frequency.  Musculoskeletal: Positive for myalgias.  All other systems reviewed and are negative.     Allergies  Review of patient's allergies  indicates no known allergies.  Home Medications   Prior to Admission medications   Medication Sig Start Date End Date Taking? Authorizing Provider  cephALEXin (KEFLEX) 500 MG capsule Take 1 capsule (500 mg total) by mouth 4 (four) times daily. 05/16/13   Trevor Maceobyn M Albert, PA-C  diclofenac (VOLTAREN) 50 MG EC tablet Take 1 tablet (50 mg total) by mouth 2 (two) times daily as needed. 05/04/13   Rodolph BongEvan S Corey, MD  meloxicam (MOBIC) 15 MG tablet Take 1 tablet (15 mg total) by mouth daily. 05/16/13   Trevor Maceobyn M Albert, PA-C  ondansetron (ZOFRAN) 8 MG tablet Take 1 tablet (8 mg total) by mouth every 8 (eight) hours as needed for nausea or vomiting. 05/04/13   Rodolph BongEvan S Corey, MD   BP 100/57  Pulse 111  Temp(Src) 98.4 F (36.9 C) (Oral)  Resp 18  Ht 5\' 3"  (1.6 m)  Wt 157 lb 14.4 oz (71.623 kg)  BMI 27.98 kg/m2  SpO2 99%  LMP 05/17/2013 Physical Exam  Constitutional: She is oriented to person, place, and time. She appears well-developed and well-nourished. No distress.  HENT:  Head: Normocephalic and atraumatic.  Right Ear: Hearing normal.  Left Ear: Hearing normal.  Nose: Nose normal.  Mouth/Throat: Oropharynx is clear and moist and mucous membranes are normal.  Eyes: Conjunctivae and EOM are normal. Pupils are equal, round, and reactive to light.  Neck: Normal range of motion. Neck supple.  Cardiovascular: Regular rhythm, S1 normal and S2 normal.  Exam reveals no gallop and no friction rub.   No murmur heard. Pulmonary/Chest:  Effort normal and breath sounds normal. No respiratory distress. She exhibits no tenderness.  Abdominal: Soft. Normal appearance and bowel sounds are normal. There is no hepatosplenomegaly. There is no tenderness. There is no rebound, no guarding, no tenderness at McBurney's point and negative Murphy's sign. No hernia.  Musculoskeletal: Normal range of motion.  Neurological: She is alert and oriented to person, place, and time. She has normal strength. No cranial nerve deficit  or sensory deficit. Coordination normal. GCS eye subscore is 4. GCS verbal subscore is 5. GCS motor subscore is 6.  Skin: Skin is warm, dry and intact. No rash noted. No cyanosis.  Psychiatric: She has a normal mood and affect. Her speech is normal and behavior is normal. Thought content normal.    ED Course  Procedures (including critical care time) Labs Review Labs Reviewed  CBC WITH DIFFERENTIAL - Abnormal; Notable for the following:    Hemoglobin 11.4 (*)    HCT 33.8 (*)    Platelets 148 (*)    Neutrophils Relative % 81 (*)    All other components within normal limits  COMPREHENSIVE METABOLIC PANEL - Abnormal; Notable for the following:    Potassium 3.3 (*)    All other components within normal limits  URINALYSIS, ROUTINE W REFLEX MICROSCOPIC - Abnormal; Notable for the following:    APPearance CLOUDY (*)    pH 8.5 (*)    Ketones, ur 15 (*)    Protein, ur 30 (*)    Leukocytes, UA LARGE (*)    All other components within normal limits  URINE MICROSCOPIC-ADD ON - Abnormal; Notable for the following:    Bacteria, UA MANY (*)    All other components within normal limits  URINE CULTURE  LIPASE, BLOOD  POC URINE PREG, ED    Imaging Review Ct Abdomen Pelvis Wo Contrast  06/02/2013   CLINICAL DATA:  Back pain, recurrent urinary tract infection  EXAM: CT ABDOMEN AND PELVIS WITHOUT CONTRAST  TECHNIQUE: Multidetector CT imaging of the abdomen and pelvis was performed following the standard protocol without IV contrast.  COMPARISON:  No similar prior exam is available at this institution for comparison or on [facility] PACS.  FINDINGS: Lung bases are clear.  There is mild right hydroureteronephrosis visualized to the level of the ureter crossing over the iliac vessels with apparent distal decompression. No radiopaque renal or ureteral calculus is identified. There is trace right perinephric stranding. Mild right urothelial subjective prominence is identified. Unenhanced liver, gallbladder,  spleen, pancreas, and adrenal glands are normal. Left kidney appears normal.  Appendix is normal. No bowel wall thickening or focal segmental dilatation. Uterus and ovaries appear normal. IUD in place, appropriately located by CT. Trace free pelvic fluid is likely physiologic. Bladder is normal.  No acute osseous abnormality.  IMPRESSION: Mild right hydroureteronephrosis to the level of the ureteral crossover at the iliac vessels of the pelvic inlet. Imaging findings may be due to a radiolucent stone, recently passed stone, or pyelonephritis. Further evaluation of the renal parenchyma for possible complication of pyelonephritis could include renal ultrasound, or CT abdomen/pelvis with IV contrast.   Electronically Signed   By: Christiana PellantGretchen  Green M.D.   On: 06/02/2013 15:23     EKG Interpretation None      MDM   Final diagnoses:  Pyelonephritis    Returns to the ER with urinary symptoms. Patient was treated 2 weeks ago for urinary tract infection with a course of Keflex. She reports that the symptoms started again yesterday. She is  having fever like symptoms with chills and body aches and has noticed urinary frequency, dysuria and hematuria. Abdominal exam is benign. Patient's laboratories entirely normal. Urinalysis is consistent with recurrent infection. With her nausea and vomiting, pyelonephritis is considered. Ureterolithiasis is also considered, as she has had recurrence of infection very quickly after stopping antibiotics. Colonized stone was considered. CT scan was therefore performed. There were no radiopaque stones are noted. There is hydroureteronephrosis present. Basilar no obvious stones, is felt that pyelonephritis is the cause of this. Patient is to IV Cipro here in the ER. Her culture from previous infection 2 weeks ago was reviewed. Escherichia coli pansensitive to everything was the result. I have opted to go with Cipro this time, she was treated with cephalosporin previously. Return to  the ER if symptoms worsen, especially high fever, vomiting.    Gilda Crease, MD 06/02/13 1535

## 2013-06-04 LAB — URINE CULTURE: Colony Count: >=100000

## 2013-06-06 NOTE — Telephone Encounter (Signed)
Post ED Visit - Positive Culture Follow-up  Culture report reviewed by antimicrobial stewardship pharmacist: [x]  Wes Dulaney, Pharm.D., BCPS []  Celedonio MiyamotoJeremy Frens, Pharm.D., BCPS []  Georgina PillionElizabeth Martin, Pharm.D., BCPS []  TuscaloosaMinh Pham, [address] Rainbow BoulevardPharm.D., BCPS, AAHIVP []  Estella HuskMichelle Turner, Pharm.D., BCPS, AAHIVP []  Harvie JuniorNathan Cope, Pharm.D.  Positive urine culture Treated with Keflex and Cipro, organism sensitive to the same and no further patient follow-up is required at this time.  Gerrad Welker 06/06/2013, 10:23 AM

## 2013-08-21 ENCOUNTER — Ambulatory Visit
Admission: RE | Admit: 2013-08-21 | Discharge: 2013-08-21 | Disposition: A | Discharge status: Home / Self Care | Payer: No Typology Code available for payment source | Source: Ambulatory Visit | Attending: Infectious Disease | Admitting: Infectious Disease

## 2013-08-21 ENCOUNTER — Other Ambulatory Visit: Payer: Self-pay | Admitting: Infectious Disease

## 2013-08-21 DIAGNOSIS — R7611 Nonspecific reaction to tuberculin skin test without active tuberculosis: Secondary | ICD-10-CM

## 2013-10-04 ENCOUNTER — Encounter (HOSPITAL_COMMUNITY): Payer: Self-pay | Admitting: Emergency Medicine

## 2013-10-04 ENCOUNTER — Emergency Department (HOSPITAL_COMMUNITY)
Admission: EM | Admit: 2013-10-04 | Discharge: 2013-10-04 | Disposition: A | Discharge status: Home / Self Care | Payer: 59 | Attending: Emergency Medicine | Admitting: Emergency Medicine

## 2013-10-04 DIAGNOSIS — Z792 Long term (current) use of antibiotics: Secondary | ICD-10-CM | POA: Insufficient documentation

## 2013-10-04 DIAGNOSIS — M25572 Pain in left ankle and joints of left foot: Secondary | ICD-10-CM

## 2013-10-04 DIAGNOSIS — R21 Rash and other nonspecific skin eruption: Secondary | ICD-10-CM

## 2013-10-04 DIAGNOSIS — R531 Weakness: Secondary | ICD-10-CM

## 2013-10-04 DIAGNOSIS — G8929 Other chronic pain: Secondary | ICD-10-CM | POA: Insufficient documentation

## 2013-10-04 DIAGNOSIS — Z8619 Personal history of other infectious and parasitic diseases: Secondary | ICD-10-CM | POA: Insufficient documentation

## 2013-10-04 DIAGNOSIS — R1110 Vomiting, unspecified: Secondary | ICD-10-CM | POA: Insufficient documentation

## 2013-10-04 DIAGNOSIS — R011 Cardiac murmur, unspecified: Secondary | ICD-10-CM | POA: Insufficient documentation

## 2013-10-04 DIAGNOSIS — R5383 Other fatigue: Secondary | ICD-10-CM

## 2013-10-04 DIAGNOSIS — R5381 Other malaise: Secondary | ICD-10-CM | POA: Insufficient documentation

## 2013-10-04 DIAGNOSIS — M25579 Pain in unspecified ankle and joints of unspecified foot: Secondary | ICD-10-CM | POA: Insufficient documentation

## 2013-10-04 HISTORY — DX: Unspecified viral hepatitis B without hepatic coma: B19.10

## 2013-10-04 LAB — CBC WITH DIFFERENTIAL/PLATELET
BASOS ABS: 0 10*3/uL (ref 0.0–0.1)
Basophils Relative: 1 % (ref 0–1)
Eosinophils Absolute: 0.1 10*3/uL (ref 0.0–0.7)
Eosinophils Relative: 2 % (ref 0–5)
HCT: 36 % (ref 36.0–46.0)
Hemoglobin: 12.1 g/dL (ref 12.0–15.0)
LYMPHS ABS: 2.3 10*3/uL (ref 0.7–4.0)
LYMPHS PCT: 54 % — AB (ref 12–46)
MCH: 29.1 pg (ref 26.0–34.0)
MCHC: 33.6 g/dL (ref 30.0–36.0)
MCV: 86.5 fL (ref 78.0–100.0)
Monocytes Absolute: 0.2 10*3/uL (ref 0.1–1.0)
Monocytes Relative: 4 % (ref 3–12)
NEUTROS ABS: 1.6 10*3/uL — AB (ref 1.7–7.7)
NEUTROS PCT: 39 % — AB (ref 43–77)
PLATELETS: 150 10*3/uL (ref 150–400)
RBC: 4.16 MIL/uL (ref 3.87–5.11)
RDW: 12.7 % (ref 11.5–15.5)
WBC: 4.2 10*3/uL (ref 4.0–10.5)

## 2013-10-04 LAB — COMPREHENSIVE METABOLIC PANEL
ALT: 9 U/L (ref 0–35)
ANION GAP: 11 (ref 5–15)
AST: 14 U/L (ref 0–37)
Albumin: 3.6 g/dL (ref 3.5–5.2)
Alkaline Phosphatase: 60 U/L (ref 39–117)
BUN: 10 mg/dL (ref 6–23)
CHLORIDE: 104 meq/L (ref 96–112)
CO2: 25 meq/L (ref 19–32)
Calcium: 9.2 mg/dL (ref 8.4–10.5)
Creatinine, Ser: 0.72 mg/dL (ref 0.50–1.10)
GFR calc Af Amer: >90 mL/min (ref >90)
Glucose, Bld: 80 mg/dL (ref 70–99)
Potassium: 4 mEq/L (ref 3.7–5.3)
Sodium: 140 mEq/L (ref 137–147)
Total Protein: 7.1 g/dL (ref 6.0–8.3)

## 2013-10-04 MED ORDER — TRIAMCINOLONE ACETONIDE 0.025 % EX OINT: 1.0000 "application " | TOPICAL_OINTMENT | Freq: Two times a day (BID) | CUTANEOUS | Status: DC

## 2013-10-04 MED ORDER — HYDROXYZINE PAMOATE 25 MG PO CAPS: 25.0000 mg | ORAL_CAPSULE | Freq: Three times a day (TID) | ORAL | Status: DC | PRN

## 2013-10-04 NOTE — ED Provider Notes (Signed)
CSN: [id number]     Arrival date & time 10/04/13  2130 History   First MD Initiated Contact with Patient 10/04/13 1113     Chief Complaint  Patient presents with  . Weakness  . Emesis  . Rash     (Consider location/radiation/quality/duration/timing/severity/associated sxs/prior Treatment) HPI Comments: Patient presents with multiple complaints: Rash.  X2 months, weakness for one week, and left ankle pain.  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   Rash: On her left arm and recently on her left check. Rash is itchy but not painful.  Daughter had similar rash that has resolved.   Weakness: She says her "body has no Strength" but is specify further. Denies unilateral weakness, chest pain, SOB, melena or recent infections.   Left ankle pain: She report chronic left lateral ankle pain "deep inside". Denies swelling, erythema, previous injury or recent trauma.   Patient is a 33 y.o. female presenting with weakness, vomiting, and rash.  Weakness This is a new problem. The current episode started in the past 7 days. The problem occurs constantly. The problem has been unchanged. Associated symptoms include a rash and weakness. Pertinent negatives include no abdominal pain, chest pain, chills, congestion, coughing, fever, joint swelling, nausea, urinary symptoms or vomiting.  Emesis Associated symptoms: no abdominal pain, no chills and no diarrhea   Rash Location:  Shoulder/arm Shoulder/arm rash location:  L upper arm Quality: itchiness   Duration:  2 months Timing:  Intermittent Context: not exposure to similar rash, not food, not medications, not new detergent/soap, not pregnancy and not sick contacts   Ineffective treatments: cream. Associated symptoms: no abdominal pain, no diarrhea, no fever, no nausea, no shortness of breath and not vomiting     Past Medical History  Diagnosis Date  . Hepatitis B   . TB (tuberculosis)    History reviewed. No pertinent  past surgical history. Family History  Problem Relation Age of Onset  . Anesthesia problems Neg Hx   . Hypotension Neg Hx   . Malignant hyperthermia Neg Hx   . Pseudochol deficiency Neg Hx    History  Substance Use Topics  . Smoking status: Never Smoker   . Smokeless tobacco: Never Used  . Alcohol Use: No   OB History   Grav Para Term Preterm Abortions TAB SAB Ect Mult Living   Review of Systems  Constitutional: Negative for fever and chills.  HENT: Negative for congestion.   Respiratory: Negative for cough and shortness of breath.   Cardiovascular: Negative for chest pain and palpitations.  Gastrointestinal: Negative for nausea, vomiting, abdominal pain and diarrhea.  Musculoskeletal: Negative for joint swelling.  Skin: Positive for rash.  Neurological: Positive for weakness.      Allergies  Review of patient's allergies indicates no known allergies.  Home Medications   Prior to Admission medications   Medication Sig Start Date End Date Taking? Authorizing Provider  rifampin (RIFADIN) 300 MG capsule Take 600 mg by mouth daily.   Yes Historical Provider, MD  hydrOXYzine (VISTARIL) 25 MG capsule Take 1 capsule (25 mg total) by mouth every 8 (eight) hours as needed for itching. 10/04/13   Jamal Collin, MD  triamcinolone (KENALOG) 0.025 % ointment Apply 1 application topically 2 (two) times daily. Apply to rash on arm 10/04/13   Jamal Collin, MD   BP 115/69  Pulse 97  Temp(Src) 97.1 F (36.2  C) (Oral)  Resp 18  Ht  (1.651 m)  Wt 158 lb (71.668 kg)  BMI 26.29 kg/m2  SpO2 100%  LMP 09/10/2013 Physical Exam  Constitutional: She is oriented to person, place, and time. She appears well-developed and well-nourished. No distress.  HENT:  Mouth/Throat: Oropharynx is clear and moist.  Eyes: EOM are normal.  Cardiovascular: Normal rate and regular rhythm.   Murmur heard. Pulmonary/Chest: Effort normal and breath sounds normal. No respiratory  distress. She has no wheezes.  Abdominal: She exhibits no distension. There is no tenderness. There is no rebound and no guarding.  Musculoskeletal: Normal range of motion. She exhibits no edema and no tenderness.  Left Ankle & Foot: Inspection: No visible swelling, ecchymosis, erythema, ulcers, calluses, blisters Palpation: No tenderness  ROM: Full in plantarflexion, dorsiflexion, inversion, and eversion of the foot; flexion and extension of the toes  Strength: 5/5 in all directions. Sensation: intact Vascular: intact w/ dorsalis pedis & posterior tibialis pulses 2+      Neurological: She is alert and oriented to person, place, and time. No cranial nerve deficit.  Skin: Skin is warm.  Hyperpigmented macular papule rash on left upper arm, with secondary excoriations    ED Course  Procedures (including critical care time) Labs Review Labs Reviewed  COMPREHENSIVE METABOLIC PANEL - Abnormal; Notable for the following:    Total Bilirubin <0.2 (*)    All other components within normal limits  CBC WITH DIFFERENTIAL - Abnormal; Notable for the following:    Neutrophils Relative % 39 (*)    Neutro Abs 1.6 (*)    Lymphocytes Relative 54 (*)    All other components within normal limits    Imaging Review No results found.   EKG Interpretation None      MDM   Final diagnoses:  Weakness  Rash  Ankle pain, chronic, left   Patient evaluated for multiple complaints: Weakness, rash, and ankle pain.  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  Rash present prior to beginning rifampin.  CBC and CMP within normal limits, showing no signs of hepatotoxicity or anemia.  Ankle pain is chronic in nature without any physical findings of erythema or swelling.  Patient given triamcinolone cream to apply to rash, and Vistaril for itching.  No signs of medication toxicity from rifampin.  Patient advised to continue to followup with health Department for ongoing  treatment of ITB and medications Surveillance/management.  His ankle pain was chronic in nature, with no signs of acute pathology.  Patient was advised to establish and followup with primary care physician.     Jamal Collin, MD 10/04/13 [phone]

## 2013-10-04 NOTE — ED Notes (Signed)
Pt denies vomiting all this week. States she vomited last week

## 2013-10-04 NOTE — ED Provider Notes (Signed)
33 y.o. Female recently started on rifampin with multiple complaints including weakness, rash and ankle pain.  Labs checked and lft appear normal  Rash does not appear c.w. Allergic reaction.  WDWN female in nad lue with hyperpigmented rash Left ankle nontender, no swelling, no signs of dvt  I saw and evaluated the patient, reviewed the resident's note and I agree with the findings and plan.   EKG Interpretation None        Hilario Quarry, MD 10/04/13 [phone]

## 2013-10-04 NOTE — ED Notes (Signed)
Pt c/o feeling weak and tired, rash on left arm and left side of face, intermittent vomiting for more than 3 weeks. Pt is currently on Rifampin. Pt reports that naproxen is not helping with her pain. Pt also c/o swelling and pain to left ankle.

## 2013-10-04 NOTE — Discharge Instructions (Signed)
Continue to follow-up with the health department as directed for Latent TB treatment and medication management  Rifampin capsules What is this medicine? RIFAMPIN (RIF am pin) is an antibiotic. It is used to treat or prevent certain kinds of bacterial infections. It is used to treat or prevent tuberculosis (TB). It will not work for colds, flu, or other viral infections. This medicine may be used for other purposes; ask your health care provider or pharmacist if you have questions. COMMON BRAND NAME(S): Rifadin, Rimactane What should I tell my health care provider before I take this medicine? They need to know if you have any of these conditions: -liver disease, including hepatitis -an unusual or allergic reaction to rifampin, rifabutin, other medicines, foods, dyes or preservatives -pregnant or trying to get pregnant -breast-feeding How should I use this medicine? Take this medicine by mouth with a glass of water. Follow the directions on the prescription label. Take this medicine on an empty stomach, either 1 hour before or 2 hours after food. Take your doses at regular intervals. Do not take your medicine more often than directed. For your therapy to work as well as possible, take each dose exactly as prescribed. Do not skip doses or stop your medicine even if you feel better. Skipping doses may make the TB resistant to this medicine and other medicines. Do not stop taking except on your doctor's advice. Contact your pediatrician or health care professional regarding the use of this medicine in children. Special care may be needed. Overdosage: If you think you have taken too much of this medicine contact a poison control center or emergency room at once. NOTE: This medicine is only for you. Do not share this medicine with others. What if I miss a dose? If you miss a dose, take it as soon as you can. If it is almost time for your next dose, take only that dose. Do not take double or extra  doses. What may interact with this medicine? Do not take this medicine with any of the following medications: -delavirdine -nevirapine -sirolimus -voriconazole This medicine may also interact with the following medications: -antibiotics like ciprofloxacin, clarithromycin, isoniazid -antifungal medicines like fluconazole, itraconazole, ketoconazole -atovaquone -chloramphenicol -cyclosporine -dapsone -female hormones, including contraceptive or birth control pills -halothane -medicines for blood pressure, other heart problems -medicines for depression, anxiety, or psychotic disturbances -medicines for diabetes -medicines for pain -medicnes for seizures like carbamazepine, phenobarbital, phenytoin -medicines for sleep -medicines for the thyroid -medicines that treat or prevent blood clots like warfarin -probenecid -steroid medicines like prednisone or cortisone -vitamin D This list may not describe all possible interactions. Give your health care provider a list of all the medicines, herbs, non-prescription drugs, or dietary supplements you use. Also tell them if you smoke, drink alcohol, or use illegal drugs. Some items may interact with your medicine. What should I watch for while using this medicine? Visit your doctor or health care professional for regular checks on your progress. This medicine can cause serious liver problems. Make sure you understand the risks for liver problems and how to identify the symptoms. If you have any questions, talk with your doctor or other health care provider. Avoid alcoholic drinks while you are taking this medicine. Drinking alcohol during treatment with this medicine increases the risk of serious liver problems. Birth control pills may not work properly while you are taking this medicine. Talk to your doctor about using an extra method of birth control. Antacids may reduce the absorption of this medicine.  Doses of this medicine should be given at  least 1 hour before taking antacids. This medicine can color your urine, feces (stool), perspiration (sweat), tears, sputum, skin or saliva reddish-orange to reddish-brown. This color can last for as long as you take this medicine and is not a cause for alarm. This color in tears may permanently stain soft contact lenses. It is better not to wear soft contact lenses while you are taking this medicine. What side effects may I notice from receiving this medicine? Side effects that you should report to your doctor or health care professional as soon as possible: -allergic reactions like skin rash, itching or hives, swelling of the face, lips, or tongue -breathing problems -feeling faint or lightheaded, falls -fever or chills, mouth sores, or sore throat -pinpoint red spots on the skin -stomach pain -trouble passing urine or change in the amount of urine -unusual bleeding, bruising -unusually weak or tired -yellowing of the eyes or skin Side effects that usually do not require medical attention (report to your doctor or health care professional if they continue or are bothersome): -diarrhea -dizziness -headache -loss of appetite -nausea, vomiting This list may not describe all possible side effects. Call your doctor for medical advice about side effects. You may report side effects to FDA at 1-800-FDA-1088. Where should I keep my medicine? Keep out of the reach of children. Store at room temperature below 30 degrees C (86 degrees F). Protect from light and moisture. Keep container tightly closed. Throw away any unused medicine after the expiration date. NOTE: This sheet is a summary. It may not cover all possible information. If you have questions about this medicine, talk to your doctor, pharmacist, or health care provider.  2015, [employer] Standard. (2007-08-14 17:17:54)  Weakness Weakness is a lack of strength. It may be felt all over the body (generalized) or in one specific part of  the body (focal). Some causes of weakness can be serious. You may need further medical evaluation, especially if you are elderly or you have a history of immunosuppression (such as chemotherapy or HIV), kidney disease, heart disease, or diabetes. CAUSES  Weakness can be caused by many different things, including:  Infection.  Physical exhaustion.  Internal bleeding or other blood loss that results in a lack of red blood cells (anemia).  Dehydration. This cause is more common in elderly people.  Side effects or electrolyte abnormalities from medicines, such as pain medicines or sedatives.  Emotional distress, anxiety, or depression.  Circulation problems, especially severe peripheral arterial disease.  Heart disease, such as rapid atrial fibrillation, bradycardia, or heart failure.  Nervous system disorders, such as Guillain-Barr syndrome, multiple sclerosis, or stroke. DIAGNOSIS  To find the cause of your weakness, your caregiver will take your history and perform a physical exam. Lab tests or X-rays may also be ordered, if needed. TREATMENT  Treatment of weakness depends on the cause of your symptoms and can vary greatly. HOME CARE INSTRUCTIONS   Rest as needed.  Eat a well-balanced diet.  Try to get some exercise every day.  Only take over-the-counter or prescription medicines as directed by your caregiver. SEEK MEDICAL CARE IF:   Your weakness seems to be getting worse or spreads to other parts of your body.  You develop new aches or pains. SEEK IMMEDIATE MEDICAL CARE IF:   You cannot perform your normal daily activities, such as getting dressed and feeding yourself.  You cannot walk up and down stairs, or you feel exhausted when you  do so.  You have shortness of breath or chest pain.  You have difficulty moving parts of your body.  You have weakness in only one area of the body or on only one side of the body.  You have a fever.  You have trouble speaking or  swallowing.  You cannot control your bladder or bowel movements.  You have black or bloody vomit or stools. MAKE SURE YOU:  Understand these instructions.  Will watch your condition.  Will get help right away if you are not doing well or get worse. Document Released: 01/03/2005 Document Revised: 07/05/2011 Document Reviewed: 03/04/2011 [facility] Patient Information 2015 [place], [place]. This information is not intended to replace advice given to you by your health care provider. Make sure you discuss any questions you have with your health care provider.

## 2013-11-18 ENCOUNTER — Encounter (HOSPITAL_COMMUNITY): Payer: Self-pay | Admitting: Emergency Medicine

## 2014-05-12 ENCOUNTER — Encounter (HOSPITAL_COMMUNITY): Payer: Self-pay | Admitting: Emergency Medicine

## 2014-05-12 ENCOUNTER — Emergency Department (HOSPITAL_COMMUNITY)
Admission: EM | Admit: 2014-05-12 | Discharge: 2014-05-12 | Disposition: A | Discharge status: Home / Self Care | Payer: 59 | Source: Home / Self Care | Attending: Family Medicine | Admitting: Family Medicine

## 2014-05-12 DIAGNOSIS — T700XXA Otitic barotrauma, initial encounter: Secondary | ICD-10-CM

## 2014-05-12 DIAGNOSIS — R0982 Postnasal drip: Secondary | ICD-10-CM | POA: Diagnosis not present

## 2014-05-12 DIAGNOSIS — B354 Tinea corporis: Secondary | ICD-10-CM

## 2014-05-12 DIAGNOSIS — H6093 Unspecified otitis externa, bilateral: Secondary | ICD-10-CM

## 2014-05-12 DIAGNOSIS — J301 Allergic rhinitis due to pollen: Secondary | ICD-10-CM | POA: Diagnosis not present

## 2014-05-12 LAB — POCT RAPID STREP A: STREPTOCOCCUS, GROUP A SCREEN (DIRECT): NEGATIVE

## 2014-05-12 MED ORDER — CLOTRIMAZOLE-BETAMETHASONE 1-0.05 % EX CREA: TOPICAL_CREAM | CUTANEOUS | Status: DC

## 2014-05-12 MED ORDER — NEOMYCIN-POLYMYXIN-HC 3.5-10000-1 OT SUSP: 4.0000 [drp] | Freq: Four times a day (QID) | OTIC | Status: DC

## 2014-05-12 NOTE — ED Provider Notes (Signed)
CSN: [id number]     Arrival date & time 05/12/14  1415 History   First MD Initiated Contact with Patient 05/12/14 1543     Chief Complaint  Patient presents with  . Ear Drainage  . Rash  . Sore Throat   (Consider location/radiation/quality/duration/timing/severity/associated sxs/prior Treatment) HPI Comments: 34 year old African female states that she has had a rash on her left upper arm and elbow also known for at least a year. More recently she is having pain and drainage from both ears. She is having pain within the years as well as behind the mandible. Complaining of PND and sore throat She states her daughter has the same type rash on her scalp that she does on her left arm.  Patient is a 34 y.o. female presenting with ear drainage, rash, and pharyngitis.  Ear Drainage Pertinent negatives include no shortness of breath.  Rash Associated symptoms: sore throat   Associated symptoms: no fever and no shortness of breath   Sore Throat Pertinent negatives include no shortness of breath.    Past Medical History  Diagnosis Date  . Hepatitis B   . TB (tuberculosis)    History reviewed. No pertinent past surgical history. Family History  Problem Relation Age of Onset  . Anesthesia problems Neg Hx   . Hypotension Neg Hx   . Malignant hyperthermia Neg Hx   . Pseudochol deficiency Neg Hx    History  Substance Use Topics  . Smoking status: Never Smoker   . Smokeless tobacco: Never Used  . Alcohol Use: No   OB History    Gravida Para Term Preterm AB TAB SAB Ectopic Multiple Living   3 3 3       3      Review of Systems  Constitutional: Positive for activity change. Negative for fever.  HENT: Positive for ear discharge, ear pain, postnasal drip, rhinorrhea and sore throat. Negative for congestion.   Eyes: Negative.   Respiratory: Negative for cough and shortness of breath.   Cardiovascular: Negative.   Gastrointestinal: Negative.   Musculoskeletal: Negative.   Skin:  Positive for rash.  Neurological: Negative.     Allergies  Review of patient's allergies indicates no known allergies.  Home Medications   Prior to Admission medications   Medication Sig Start Date End Date Taking? Authorizing Provider  clotrimazole-betamethasone (LOTRISONE) cream Apply to affected area 2 times daily for at least 3 weeks. 05/12/14   Hayden Rasmussenavid Marializ Ferrebee, NP  hydrOXYzine (VISTARIL) 25 MG capsule Take 1 capsule (25 mg total) by mouth every 8 (eight) hours as needed for itching. 10/04/13   Jamal CollinJames R Joyner, MD  neomycin-polymyxin-hydrocortisone (CORTISPORIN) 3.5-10000-1 otic suspension Place 4 drops into both ears 4 (four) times daily. X 7 days 05/12/14   Hayden Rasmussenavid Dione Mccombie, NP  rifampin (RIFADIN) 300 MG capsule Take 600 mg by mouth daily.    Historical Provider, MD  triamcinolone (KENALOG) 0.025 % ointment Apply 1 application topically 2 (two) times daily. Apply to rash on arm 10/04/13   Jamal CollinJames R Joyner, MD   BP 120/74 mmHg  Pulse 100  Temp(Src) 97.7 F (36.5 C) (Oral)  Resp 18  SpO2 100%  LMP 04/15/2014 Physical Exam  Constitutional: She is oriented to person, place, and time. She appears well-developed and well-nourished. No distress.  HENT:  Mouth/Throat: No oropharyngeal exudate.  Bilateral TMs are primarily translucent and retracted. The EACs are mildly swollen and moist. Pulling on the outer ear produces pain to the EACs. Oropharynx difficult to visualize due to patient's  tongue retraction. What I can see is there is mild pharyngeal injection and clear PND.  Eyes: EOM are normal. Pupils are equal, round, and reactive to light.  Neck: Normal range of motion. Neck supple.  Cardiovascular: Normal rate, regular rhythm and normal heart sounds.   Pulmonary/Chest: Effort normal. No respiratory distress. She has no wheezes.  Musculoskeletal: She exhibits no edema.  Lymphadenopathy:    She has no cervical adenopathy.  Neurological: She is alert and oriented to person, place, and time. She  exhibits normal muscle tone.  Skin: Skin is warm and dry.  Psychiatric: She has a normal mood and affect.  Vitals reviewed.   ED Course  Procedures (including critical care time) Labs Review Labs Reviewed  CULTURE, GROUP A STREP  POCT RAPID STREP A ([facility]RG CARE ONLY)    Imaging Review No results found.   MDM   1. Tinea corporis   2. Bilateral otitis externa   3. Allergic rhinitis due to pollen   4. PND (post-nasal drip)   5. Barotitis media, initial encounter     Rhinocort or Flonase nasal spray daily. Zyrtec 10 mg or Allegra 180 mg daily for drainage and allergies. Sudafed PE 10 mg every 4-6 hours as needed for congestion   Use the Lotrisone cream twice a day for 3 weeks to arm Cortisporin ear gtts   Hayden Rasmussen, NP 05/12/14 1607

## 2014-05-12 NOTE — ED Notes (Signed)
C/o left arm rash, bilateral ear pain and sore throat  States sx started 4/10 Have been using triamcolone cream for rash States she has bilateral ear pain with drainage She has been using cotton balls to stop drainage States throat is itchy and hurts States ears are itchy

## 2014-05-12 NOTE — Discharge Instructions (Signed)
Allergic Rhinitis Rhinocort or Flonase nasal spray daily. Zyrtec 10 mg or Allegra 180 mg daily for drainage and allergies. Sudafed PE 10 mg every 4-6 hours as needed for congestion Allergic rhinitis is when the mucous membranes in the nose respond to allergens. Allergens are particles in the air that cause your body to have an allergic reaction. This causes you to release allergic antibodies. Through a chain of events, these eventually cause you to release histamine into the blood stream. Although meant to protect the body, it is this release of histamine that causes your discomfort, such as frequent sneezing, congestion, and an itchy, runny nose.  CAUSES  Seasonal allergic rhinitis (hay fever) is caused by pollen allergens that may come from grasses, trees, and weeds. Year-round allergic rhinitis (perennial allergic rhinitis) is caused by allergens such as house dust mites, pet dander, and mold spores.  SYMPTOMS   Nasal stuffiness (congestion).  Itchy, runny nose with sneezing and tearing of the eyes. DIAGNOSIS  Your health care provider can help you determine the allergen or allergens that trigger your symptoms. If you and your health care provider are unable to determine the allergen, skin or blood testing may be used. TREATMENT  Allergic rhinitis does not have a cure, but it can be controlled by:  Medicines and allergy shots (immunotherapy).  Avoiding the allergen. Hay fever may often be treated with antihistamines in pill or nasal spray forms. Antihistamines block the effects of histamine. There are over-the-counter medicines that may help with nasal congestion and swelling around the eyes. Check with your health care provider before taking or giving this medicine.  If avoiding the allergen or the medicine prescribed do not work, there are many new medicines your health care provider can prescribe. Stronger medicine may be used if initial measures are ineffective. Desensitizing injections  can be used if medicine and avoidance does not work. Desensitization is when a patient is given ongoing shots until the body becomes less sensitive to the allergen. Make sure you follow up with your health care provider if problems continue. HOME CARE INSTRUCTIONS It is not possible to completely avoid allergens, but you can reduce your symptoms by taking steps to limit your exposure to them. It helps to know exactly what you are allergic to so that you can avoid your specific triggers. SEEK MEDICAL CARE IF:   You have a fever.  You develop a cough that does not stop easily (persistent).  You have shortness of breath.  You start wheezing.  Symptoms interfere with normal daily activities. Document Released: 09/28/2000 Document Revised: 01/08/2013 Document Reviewed: 09/10/2012 [facility] Patient Information 2015 [place], [place]. This information is not intended to replace advice given to you by your health care provider. Make sure you discuss any questions you have with your health care provider.  Barotitis Media Barotitis media is inflammation of your middle ear. This occurs when the auditory tube (eustachian tube) leading from the back of your nose (nasopharynx) to your eardrum is blocked. This blockage may result from a cold, environmental allergies, or an upper respiratory infection. Unresolved barotitis media may lead to damage or hearing loss (barotrauma), which may become permanent. HOME CARE INSTRUCTIONS   Use medicines as recommended by your health care provider. Over-the-counter medicines will help unblock the canal and can help during times of air travel.  Do not put anything into your ears to clean or unplug them. Eardrops will not be helpful.  Do not swim, dive, or fly until your health care provider  says it is all right to do so. If these activities are necessary, chewing gum with frequent, forceful swallowing may help. It is also helpful to hold your nose and gently blow to pop  your ears for equalizing pressure changes. This forces air into the eustachian tube.  Only take over-the-counter or prescription medicines for pain, discomfort, or fever as directed by your health care provider.  A decongestant may be helpful in decongesting the middle ear and make pressure equalization easier. SEEK MEDICAL CARE IF:  You experience a serious form of dizziness in which you feel as if the room is spinning and you feel nauseated (vertigo).  Your symptoms only involve one ear. SEEK IMMEDIATE MEDICAL CARE IF:   You develop a severe headache, dizziness, or severe ear pain.  You have bloody or pus-like drainage from your ears.  You develop a fever.  Your problems do not improve or become worse. MAKE SURE YOU:   Understand these instructions.  Will watch your condition.  Will get help right away if you are not doing well or get worse. Document Released: 01/01/2000 Document Revised: 10/24/2012 Document Reviewed: 07/31/2012 [facility] Patient Information 2015 [place], [place]. This information is not intended to replace advice given to you by your health care provider. Make sure you discuss any questions you have with your health care provider.  Body Ringworm Use the Lotrisone cream twice a day for 3 weeks Ringworm (tinea corporis) is a fungal infection of the skin on the body. This infection is not caused by worms, but is actually caused by a fungus. Fungus normally lives on the top of your skin and can be useful. However, in the case of ringworms, the fungus grows out of control and causes a skin infection. It can involve any area of skin on the body and can spread easily from one person to another (contagious). Ringworm is a common problem for children, but it can affect adults as well. Ringworm is also often found in athletes, especially wrestlers who share equipment and mats.  CAUSES  Ringworm of the body is caused by a fungus called dermatophyte. It can spread  by:  Touchingother people who are infected.  Touchinginfected pets.  Touching or sharingobjects that have been in contact with the infected person or pet (hats, combs, towels, clothing, sports equipment). SYMPTOMS   Itchy, raised red spots and bumps on the skin.  Ring-shaped rash.  Redness near the border of the rash with a clear center.  Dry and scaly skin on or around the rash. Not every person develops a ring-shaped rash. Some develop only the red, scaly patches. DIAGNOSIS  Most often, ringworm can be diagnosed by performing a skin exam. Your caregiver may choose to take a skin scraping from the affected area. The sample will be examined under the microscope to see if the fungus is present.  TREATMENT  Body ringworm may be treated with a topical antifungal cream or ointment. Sometimes, an antifungal shampoo that can be used on your body is prescribed. You may be prescribed antifungal medicines to take by mouth if your ringworm is severe, keeps coming back, or lasts a long time.  HOME CARE INSTRUCTIONS   Only take over-the-counter or prescription medicines as directed by your caregiver.  Wash the infected area and dry it completely before applying yourcream or ointment.  When using antifungal shampoo to treat the ringworm, leave the shampoo on the body for 3-5 minutes before rinsing.   Wear loose clothing to stop clothes from  rubbing and irritating the rash.  Wash or change your bed sheets every night while you have the rash.  Have your pet treated by your veterinarian if it has the same infection. To prevent ringworm:   Practice good hygiene.  Wear sandals or shoes in public places and showers.  Do not share personal items with others.  Avoid touching red patches of skin on other people.  Avoid touching pets that have bald spots or wash your hands after doing so. SEEK MEDICAL CARE IF:   Your rash continues to spread after 7 days of treatment.  Your rash is  not gone in 4 weeks.  The area around your rash becomes red, warm, tender, and swollen. Document Released: 01/01/2000 Document Revised: 09/28/2011 Document Reviewed: 07/18/2011 [facility] Patient Information 2015 [place], [place]. This information is not intended to replace advice given to you by your health care provider. Make sure you discuss any questions you have with your health care provider.  Draining Ear Ear wax, pus, blood and other fluids are examples of the different types of drainage from ears. Drops or cream may be needed to lessen the itching which may occur with ear drainage. CAUSES   Skin irritations in the ear.  Ear infection.  Swimmer's ear.  Ruptured eardrum.  Foreign object in the ear canal.  Sudden pressure changes.  Head injury. HOME CARE INSTRUCTIONS   Only take over-the-counter or prescription medicines for pain, fever, or discomfort as directed by your caregiver.  Do not rub the ear canal with cotton-tipped swabs.  Do not swim until your caregiver says it is okay.  Before you take a shower, cover a cotton ball with petroleum jelly to keep water out.  Limit exposure to smoke. Secondhand smoke can increase the chance for ear infections.  Keep up with immunizations.  Wash your hands well.  Keep all follow-up appointments to examine the ear and evaluate hearing. SEEK MEDICAL CARE IF:   You have increased drainage.  You have ear pain, a fever, or drainage that is not getting better after 48 hours of antibiotics.  You are unusually tired. SEEK IMMEDIATE MEDICAL CARE IF:  You have severe ear pain or headache.  The patient is older than 3 months with a rectal or oral temperature of 102 F (38.9 C) or higher.  The patient is 38 months old or younger with a rectal temperature of 100.4 F (38 C) or higher.  You vomit.  You feel dizzy.  You have a seizure.  You have new hearing loss. MAKE SURE YOU:   Understand these instructions.  Will  watch your condition.  Will get help right away if you are not doing well or get worse. Document Released: 01/03/2005 Document Revised: 03/28/2011 Document Reviewed: 11/06/2008 [facility] Patient Information 2015 [place], [place]. This information is not intended to replace advice given to you by your health care provider. Make sure you discuss any questions you have with your health care provider.  Ear Drops You need to put eardrops in your ear. HOME CARE   Put drops in your affected ear as told.  After putting in the drops, lie down with the ear you put the drops in facing up. Stay this way for 10 minutes. Use the ear drops as long as your doctor tells you.  Before you get up, put a cotton ball gently in your ear. Do not push it far in your ear.  Do not wash out your ears unless your doctor says it is okay.  Finish all medicines as told by your doctor. You may be told to keep using the eardrops even if you start to feel better.  See your doctor as told for follow-up visits. GET HELP IF:  You have pain that gets worse.  Any unusual fluid (drainage) is coming from your ear (especially if the fluid stinks).  You have trouble hearing.  You get really dizzy as if the room is spinning and feel sick to your stomach (vertigo).  The outside of your ear becomes red or puffy or both. This may be a sign of an allergic reaction. MAKE SURE YOU:   Understand these instructions.  Will watch your condition.  Will get help right away if you are not doing well or get worse. Document Released: 06/23/2009 Document Revised: 01/08/2013 Document Reviewed: 07/31/2012 [facility] Patient Information 2015 [place], [place]. This information is not intended to replace advice given to you by your health care provider. Make sure you discuss any questions you have with your health care provider.  Otitis Externa Otitis externa is a germ infection in the outer ear. The outer ear is the area from the eardrum to  the outside of the ear. Otitis externa is sometimes called "swimmer's ear." HOME CARE  Put drops in the ear as told by your doctor.  Only take medicine as told by your doctor.  If you have diabetes, your doctor may give you more directions. Follow your doctor's directions.  Keep all doctor visits as told. To avoid another infection:  Keep your ear dry. Use the corner of a towel to dry your ear after swimming or bathing.  Avoid scratching or putting things inside your ear.  Avoid swimming in lakes, dirty water, or pools that use a chemical called chlorine poorly.  You may use ear drops after swimming. Combine equal amounts of white vinegar and alcohol in a bottle. Put 3 or 4 drops in each ear. GET HELP IF:   You have a fever.  Your ear is still red, puffy (swollen), or painful after 3 days.  You still have yellowish-white fluid (pus) coming from the ear after 3 days.  Your redness, puffiness, or pain gets worse.  You have a really bad headache.  You have redness, puffiness, pain, or tenderness behind your ear. MAKE SURE YOU:   Understand these instructions.  Will watch your condition.  Will get help right away if you are not doing well or get worse. Document Released: 06/22/2007 Document Revised: 05/20/2013 Document Reviewed: 01/20/2011 [facility] Patient Information 2015 [place], [place]. This information is not intended to replace advice given to you by your health care provider. Make sure you discuss any questions you have with your health care provider.

## 2014-05-14 ENCOUNTER — Encounter (HOSPITAL_COMMUNITY): Payer: Self-pay | Admitting: Emergency Medicine

## 2014-05-14 ENCOUNTER — Emergency Department (HOSPITAL_COMMUNITY)
Admission: EM | Admit: 2014-05-14 | Discharge: 2014-05-14 | Disposition: A | Discharge status: Home / Self Care | Payer: 59 | Attending: Emergency Medicine | Admitting: Emergency Medicine

## 2014-05-14 DIAGNOSIS — Z7952 Long term (current) use of systemic steroids: Secondary | ICD-10-CM | POA: Diagnosis not present

## 2014-05-14 DIAGNOSIS — Z79899 Other long term (current) drug therapy: Secondary | ICD-10-CM | POA: Diagnosis not present

## 2014-05-14 DIAGNOSIS — B349 Viral infection, unspecified: Secondary | ICD-10-CM | POA: Diagnosis not present

## 2014-05-14 DIAGNOSIS — R21 Rash and other nonspecific skin eruption: Secondary | ICD-10-CM | POA: Insufficient documentation

## 2014-05-14 DIAGNOSIS — H6123 Impacted cerumen, bilateral: Secondary | ICD-10-CM | POA: Insufficient documentation

## 2014-05-14 DIAGNOSIS — Z8619 Personal history of other infectious and parasitic diseases: Secondary | ICD-10-CM | POA: Diagnosis not present

## 2014-05-14 DIAGNOSIS — J029 Acute pharyngitis, unspecified: Secondary | ICD-10-CM | POA: Diagnosis present

## 2014-05-14 DIAGNOSIS — Z8611 Personal history of tuberculosis: Secondary | ICD-10-CM | POA: Diagnosis not present

## 2014-05-14 LAB — CULTURE, GROUP A STREP: STREP A CULTURE: NEGATIVE

## 2014-05-14 MED ORDER — LIDOCAINE VISCOUS 2 % MT SOLN
15.0000 mL | Freq: Once | OROMUCOSAL | Status: AC
  Administered 2014-05-14: 15 mL via OROMUCOSAL
  Filled 2014-05-14: qty 15

## 2014-05-14 MED ORDER — HYDROCODONE-ACETAMINOPHEN 5-325 MG PO TABS
1.0000 | ORAL_TABLET | Freq: Once | ORAL | Status: AC
  Administered 2014-05-14: 1 via ORAL
  Filled 2014-05-14: qty 1

## 2014-05-14 MED ORDER — NAPROXEN 500 MG PO TABS: 500.0000 mg | ORAL_TABLET | Freq: Two times a day (BID) | ORAL | Status: DC

## 2014-05-14 MED ORDER — PHENYLEPHRINE-DM-GG-APAP 5-10-200-325 MG PO TABS: 2.0000 | ORAL_TABLET | Freq: Three times a day (TID) | ORAL | Status: DC

## 2014-05-14 MED ORDER — IBUPROFEN 400 MG PO TABS
600.0000 mg | ORAL_TABLET | Freq: Once | ORAL | Status: AC
  Administered 2014-05-14: 600 mg via ORAL
  Filled 2014-05-14: qty 3

## 2014-05-14 MED ORDER — TRAMADOL HCL 50 MG PO TABS
50.0000 mg | ORAL_TABLET | Freq: Four times a day (QID) | ORAL | Status: DC | PRN
Start: 2014-05-14 — End: 2015-06-16

## 2014-05-14 NOTE — ED Notes (Signed)
Pt requesting prescription for rash; reports having a "pill before that helps better than this cream. I already have a prescription for the cream."

## 2014-05-14 NOTE — ED Notes (Signed)
Pt c/o bilateral earache, sore throat, and rash. [facility] drops to use in ears; denies improvement. Throat appears normal.Large amounts of wax noted to ears. Pt also c/o rash to L upper arm and L thigh x 7 months. Reports using prescription creams "but it goes and comes right back."

## 2014-05-14 NOTE — ED Notes (Signed)
Attempted ear wax removal. No change to R ear; minimal amount of wax removed from L ear.

## 2014-05-14 NOTE — ED Provider Notes (Signed)
CSN: [id number]     Arrival date & time 05/14/14  1701 History  This chart was scribed for non-physician practitioner, Harle Battiest, NP, working with Shon Baton, MD, by Bronson Curb, ED Scribe. This patient was seen in room TR11C/TR11C and the patient's care was started at 5:51 PM.   Chief Complaint  Patient presents with  . Otalgia  . Sore Throat    The history is provided by the patient. No language interpreter was used.     HPI Comments: Katelyn Webb is a 34 y.o. female who presents to the Emergency Department complaining of bilateral ear pain for the past 2 weeks. There is associated malodorous clear drainage from both ears, headache and sore throat. Patient states she is unable to eat due to pain. She has taken Tylenol and ibuprofen without significant relief. Patient was seen at [facility] 2 days ago and given ear drops, however, she reports have symptoms have gotten worse despite using this medication. She denies fever, chills, and cough. Patient is currently on her menstrual period.  Patient is also complaining of recurrent rash to the left arm and left leg for the past 7 months. She reports using prescription topical creams with only temporary improvement.   Past Medical History  Diagnosis Date  . Hepatitis B   . TB (tuberculosis)    History reviewed. No pertinent past surgical history. Family History  Problem Relation Age of Onset  . Anesthesia problems Neg Hx   . Hypotension Neg Hx   . Malignant hyperthermia Neg Hx   . Pseudochol deficiency Neg Hx    History  Substance Use Topics  . Smoking status: Never Smoker   . Smokeless tobacco: Never Used  . Alcohol Use: No   OB History    Gravida Para Term Preterm AB TAB SAB Ectopic Multiple Living   Review of Systems  Constitutional: Negative for fever and chills.  HENT: Positive for ear discharge, ear pain and sore throat.   Respiratory: Negative for cough.   Skin: Positive for rash.       Allergies  Review of patient's allergies indicates no known allergies.  Home Medications   Prior to Admission medications   Medication Sig Start Date End Date Taking? Authorizing Provider  clotrimazole-betamethasone (LOTRISONE) cream Apply to affected area 2 times daily for at least 3 weeks. 05/12/14   Hayden Rasmussen, NP  hydrOXYzine (VISTARIL) 25 MG capsule Take 1 capsule (25 mg total) by mouth every 8 (eight) hours as needed for itching. 10/04/13   Jamal Collin, MD  neomycin-polymyxin-hydrocortisone (CORTISPORIN) 3.5-10000-1 otic suspension Place 4 drops into both ears 4 (four) times daily. X 7 days 05/12/14   Hayden Rasmussen, NP  rifampin (RIFADIN) 300 MG capsule Take 600 mg by mouth daily.    Historical Provider, MD  triamcinolone (KENALOG) 0.025 % ointment Apply 1 application topically 2 (two) times daily. Apply to rash on arm 10/04/13   Jamal Collin, MD   Triage Vitals: BP 119/81 mmHg  Pulse 84  Temp(Src) 98 F (36.7 C)  Resp 18  Wt 158 lb 9 oz (71.923 kg)  SpO2 100%  LMP 05/14/2014  Physical Exam  Constitutional: She is oriented to person, place, and time. She appears well-developed and well-nourished. No distress.  HENT:  Head: Normocephalic and atraumatic.  Mouth/Throat: Posterior oropharyngeal erythema present. No oropharyngeal exudate, posterior oropharyngeal edema or tonsillar abscesses.  Moderate cerumen noted in bilateral  canals, but TMs clear bilaterally. Tonsillar lymphadenopathy on the right.  Eyes: Conjunctivae and EOM are normal.  Neck: Neck supple. No tracheal deviation present.  Cardiovascular: Normal rate.   Pulmonary/Chest: Effort normal. No respiratory distress.  Musculoskeletal: Normal range of motion.  Lymphadenopathy:    She has no cervical adenopathy.  Neurological: She is alert and oriented to person, place, and time.  Skin: Skin is warm and dry. Rash noted.  Various flat, scaly patches on lateral arms. No redness or drainage noted.  Psychiatric:  She has a normal mood and affect. Her behavior is normal.  Nursing note and vitals reviewed.   ED Course  Procedures (including critical care time)  DIAGNOSTIC STUDIES: Oxygen Saturation is 100% on room air, normal by my interpretation.    COORDINATION OF CARE: At 1759 Discussed treatment plan with patient which includes pain medication and ear irrigation. Patient agrees.   Labs Review Labs Reviewed - No data to display  Imaging Review No results found.   EKG Interpretation None      MDM   Final diagnoses:  Viral illness    34 yo with continued pressure in ears and nasal congestion. Her exam is not concerning for a bacterial source of her symptoms.  Her discomfort was managed in the ED and symptomatic mgmt discussed.  At discharge she mentions an ongoing rash that her PCP has been treating but the rash is recurring. Pt has follow-up appointment with PCP and pt encouraged to follow-up with PCP for further treatment for consistency of treatment. Pt is well-appearing, in no acute distress and vital signs reviewed and not concerning. She appears safe to be discharged.  Discharge include follow-up with their PCP.  Return precautions provided. Pt aware of plan and in agreement.    I personally performed the services described in this documentation, which was scribed in my presence. The recorded information has been reviewed and is accurate.  Filed Vitals:   05/14/14 1707 05/14/14 1913  BP: 119/81 121/72  Pulse: 84 85  Temp: 98 F (36.7 C) 98.6 F (37 C)  TempSrc:  Oral  Resp: 18 20  Weight: 158 lb 9 oz (71.923 kg)   SpO2: 100% 100%   Meds given in ED:  Medications  lidocaine (XYLOCAINE) 2 % viscous mouth solution 15 mL (15 mLs Mouth/Throat Given 05/14/14 1812)  HYDROcodone-acetaminophen (NORCO/VICODIN) 5-325 MG per tablet 1 tablet (1 tablet Oral Given 05/14/14 1812)  ibuprofen (ADVIL,MOTRIN) tablet 600 mg (600 mg Oral Given 05/14/14 1812)    Discharge Medication List as  of 05/14/2014  7:02 PM    START taking these medications   Details  naproxen (NAPROSYN) 500 MG tablet Take 1 tablet (500 mg total) by mouth 2 (two) times daily., Starting 05/14/2014, Until Discontinued, Print    Phenylephrine-DM-GG-APAP (MUCINEX FAST-MAX CONGEST COLD) 5-10-200-325 MG TABS Take 2 tablets by mouth 3 (three) times daily., Starting 05/14/2014, Until Discontinued, Print    traMADol (ULTRAM) 50 MG tablet Take 1 tablet (50 mg total) by mouth every 6 (six) hours as needed., Starting 05/14/2014, Until Discontinued, Print          Harle BattiestElizabeth Glenola Wheat, NP 05/16/14 1729  Shon Batonourtney F Horton, MD 05/16/14 2001

## 2014-05-14 NOTE — Discharge Instructions (Signed)
These follow directions provided. Be sure to follow-up with your primary care doctor to make sure you're getting better. Please take the naproxen twice a day to help with pain and inflammation. Please take the tramadol to help with pain not relieved by the naproxen. Please take the multisymptom cold medicine 3 times a day to help with pressure in your ears and other symptoms. Continue to take the medicines prescribed from your first doctor visit. Don't hesitate to return for any new, worsening, or concerning symptoms.    SEEK IMMEDIATE MEDICAL CARE IF:  You have severe headaches, shortness of breath, chest pain, neck pain, or an unusual rash.  You have uncontrolled vomiting, diarrhea, or you are unable to keep down fluids.

## 2014-05-14 NOTE — ED Notes (Signed)
C/o bilateral ear pain, clear drainage from ears, and sore throat x 2 weeks.  [facility] and states ear drops are not helping pain.

## 2014-12-30 ENCOUNTER — Encounter (HOSPITAL_COMMUNITY): Payer: Self-pay | Admitting: Medical

## 2014-12-30 ENCOUNTER — Inpatient Hospital Stay (HOSPITAL_COMMUNITY)
Admission: AD | Admit: 2014-12-30 | Discharge: 2014-12-30 | Disposition: A | Discharge status: Home / Self Care | Payer: 59 | Source: Ambulatory Visit | Attending: Obstetrics & Gynecology | Admitting: Obstetrics & Gynecology

## 2014-12-30 DIAGNOSIS — R110 Nausea: Secondary | ICD-10-CM

## 2014-12-30 DIAGNOSIS — O219 Vomiting of pregnancy, unspecified: Secondary | ICD-10-CM | POA: Diagnosis not present

## 2014-12-30 DIAGNOSIS — E860 Dehydration: Secondary | ICD-10-CM | POA: Diagnosis not present

## 2014-12-30 DIAGNOSIS — Z3201 Encounter for pregnancy test, result positive: Secondary | ICD-10-CM | POA: Insufficient documentation

## 2014-12-30 LAB — URINALYSIS, ROUTINE W REFLEX MICROSCOPIC
BILIRUBIN URINE: NEGATIVE
Glucose, UA: NEGATIVE mg/dL
Hgb urine dipstick: NEGATIVE
Ketones, ur: NEGATIVE mg/dL
LEUKOCYTES UA: NEGATIVE
Nitrite: NEGATIVE
PROTEIN: NEGATIVE mg/dL
Specific Gravity, Urine: >1.03 — ABNORMAL HIGH (ref 1.005–1.030)
pH: 5.5 (ref 5.0–8.0)

## 2014-12-30 LAB — POCT PREGNANCY, URINE: Preg Test, Ur: POSITIVE — AB

## 2014-12-30 MED ORDER — LACTATED RINGERS IV BOLUS (SEPSIS)
1000.0000 mL | Freq: Once | INTRAVENOUS | Status: AC
  Administered 2014-12-30: 1000 mL via INTRAVENOUS

## 2014-12-30 MED ORDER — METOCLOPRAMIDE HCL 5 MG PO TABS: 5.0000 mg | ORAL_TABLET | Freq: Three times a day (TID) | ORAL | Status: DC | PRN

## 2014-12-30 MED ORDER — METOCLOPRAMIDE HCL 5 MG/ML IJ SOLN
10.0000 mg | Freq: Once | INTRAMUSCULAR | Status: AC
  Administered 2014-12-30: 10 mg via INTRAVENOUS
  Filled 2014-12-30: qty 2

## 2014-12-30 MED ORDER — PROMETHAZINE HCL 12.5 MG PO TABS: 12.5000 mg | ORAL_TABLET | Freq: Four times a day (QID) | ORAL | Status: DC | PRN

## 2014-12-30 NOTE — Discharge Instructions (Signed)
Morning Sickness °Morning sickness is when you feel sick to your stomach (nauseous) during pregnancy. You may feel sick to your stomach and throw up (vomit). You may feel sick in the morning, but you can feel this way any time of day. Some women feel very sick to their stomach and cannot stop throwing up (hyperemesis gravidarum). °HOME CARE °· Only take medicines as told by your doctor. °· Take multivitamins as told by your doctor. Taking multivitamins before getting pregnant can stop or lessen the harshness of morning sickness. °· Eat dry toast or unsalted crackers before getting out of bed. °· Eat 5 to 6 small meals a day. °· Eat dry and bland foods like rice and baked potatoes. °· Do not drink liquids with meals. Drink between meals. °· Do not eat greasy, fatty, or spicy foods. °· Have someone cook for you if the smell of food causes you to feel sick or throw up. °· If you feel sick to your stomach after taking prenatal vitamins, take them at night or with a snack. °· Eat protein when you need a snack (nuts, yogurt, cheese). °· Eat unsweetened gelatins for dessert. °· Wear a bracelet used for sea sickness (acupressure wristband). °· Go to a doctor that puts thin needles into certain body points (acupuncture) to improve how you feel. °· Do not smoke. °· Use a humidifier to keep the air in your house free of odors. °· Get lots of fresh air. °GET HELP IF: °· You need medicine to feel better. °· You feel dizzy or lightheaded. °· You are losing weight. °GET HELP RIGHT AWAY IF:  °· You feel very sick to your stomach and cannot stop throwing up. °· You pass out (faint). °MAKE SURE YOU: °· Understand these instructions. °· Will watch your condition. °· Will get help right away if you are not doing well or get worse. °  °This information is not intended to replace advice given to you by your health care provider. Make sure you discuss any questions you have with your health care provider. °  °Document Released: 02/11/2004  Document Revised: 01/24/2014 Document Reviewed: 06/20/2012 °Elsevier Interactive Patient Education ©2016 [employer] °Eating Plan for Hyperemesis Gravidarum °Severe cases of hyperemesis gravidarum can lead to dehydration and malnutrition. The hyperemesis eating plan is one way to lessen the symptoms of nausea and vomiting. It is often used with prescribed medicines to control your symptoms.  °WHAT CAN I DO TO RELIEVE MY SYMPTOMS? °Listen to your body. Everyone is different and has different preferences. Find what works best for you. Some of the following things may help: °· Eat and drink slowly. °· Eat 5-6 small meals daily instead of 3 large meals.   °· Eat crackers before you get out of bed in the morning.   °· Starchy foods are usually well tolerated (such as cereal, toast, bread, potatoes, pasta, rice, and pretzels).   °· Ginger may help with nausea. Add ¼ tsp ground ginger to hot tea or choose ginger tea.   °· Try drinking 100% fruit juice or an electrolyte drink. °· Continue to take your prenatal vitamins as directed by your health care provider. If you are having trouble taking your prenatal vitamins, talk with your health care provider about different options. °· Include at least 1 serving of protein with your meals and snacks (such as meats or poultry, beans, nuts, eggs, or yogurt). Try eating a protein-rich snack before bed (such as cheese and crackers or a half turkey or peanut butter sandwich). °  WHAT THINGS SHOULD I AVOID TO REDUCE MY SYMPTOMS? °The following things may help reduce your symptoms: °· Avoid foods with strong smells. Try eating meals in well-ventilated areas that are free of odors. °· Avoid drinking water or other beverages with meals. Try not to drink anything less than 30 minutes before and after meals. °· Avoid drinking more than 1 cup of fluid at a time. °· Avoid fried or high-fat foods, such as butter and cream sauces. °· Avoid spicy foods. °· Avoid skipping meals the best you can.  Nausea can be more intense on an empty stomach. If you cannot tolerate food at that time, do not force it. Try sucking on ice chips or other frozen items and make up the calories later. °· Avoid lying down within 2 hours after eating. °  °This information is not intended to replace advice given to you by your health care provider. Make sure you discuss any questions you have with your health care provider. °  °Document Released: 10/31/2006 Document Revised: 01/08/2013 Document Reviewed: 11/07/2012 °Elsevier Interactive Patient Education ©2016 [employer] ° °

## 2014-12-30 NOTE — MAU Note (Signed)
Pt states that she has had regular periods, but did not get one in November. LMP 11/15/2014. Has some lower abdominal pain but none today. Denies vag bleeding or discharge. Denies urinary s/s. Has a lot of nausea, but no vomiting.

## 2014-12-30 NOTE — MAU Provider Note (Signed)
History     CSN: [id number]  Arrival date and time: 12/30/14 2036   First Provider Initiated Contact with Patient 12/30/14 2131      Chief Complaint  Patient presents with  . Possible Pregnancy   HPI Katelyn Webb is a 34 y.o. [phone] [id number] at [id number] who presents to MAU today with complaint of possible pregnancy and nausea. The patient states that she has had significant nausea following food ingestion, but has not had vomiting. She denies abdominal pain, vaginal bleeding or discharge. She states a history of regular periods and LMP of 11/15/14. She had not taken HPT. [facility]    OB History    Gravida Para Term Preterm AB TAB SAB Ectopic Multiple Living   4 3 3       3       Past Medical History  Diagnosis Date  . Hepatitis B   . TB (tuberculosis)     History reviewed. No pertinent past surgical history.  Family History  Problem Relation Age of Onset  . Anesthesia problems Neg Hx   . Hypotension Neg Hx   . Malignant hyperthermia Neg Hx   . Pseudochol deficiency Neg Hx     Social History  Substance Use Topics  . Smoking status: Never Smoker   . Smokeless tobacco: Never Used  . Alcohol Use: No    Allergies: No Known Allergies  No prescriptions prior to admission    Review of Systems  Constitutional: Negative for fever and malaise/fatigue.  Gastrointestinal: Positive for nausea. Negative for vomiting, abdominal pain, diarrhea and constipation.  Genitourinary:       Neg - vaginal bleeding   Physical Exam   Blood pressure 106/61, pulse 89, temperature 98.2 F (36.8 C), temperature source Oral, resp. rate 18, height 5\' 2"  (1.575 m), weight 159 lb (72.122 kg), last menstrual period 11/15/2014, SpO2 100 %.  Physical Exam  Nursing note and vitals reviewed. Constitutional: She is oriented to person, place, and time. She appears well-developed and well-nourished. No distress.  HENT:  Head: Normocephalic and  atraumatic.  Cardiovascular: Normal rate.   Respiratory: Effort normal.  GI: Soft. She exhibits no distension and no mass. There is no tenderness. There is no rebound and no guarding.  Neurological: She is alert and oriented to person, place, and time.  Skin: Skin is warm and dry. No erythema.  Psychiatric: She has a normal mood and affect.    Results for orders placed or performed during the hospital encounter of 12/30/14 (from the past 24 hour(s))  Urinalysis, Routine w reflex microscopic (not at [facility])     Status: Abnormal   Collection Time: 12/30/14  9:11 PM  Result Value Ref Range   Color, Urine YELLOW YELLOW   APPearance CLEAR CLEAR   Specific Gravity, Urine >1.030 (H) 1.005 - 1.030   pH 5.5 5.0 - 8.0   Glucose, UA NEGATIVE NEGATIVE mg/dL   Hgb urine dipstick NEGATIVE NEGATIVE   Bilirubin Urine NEGATIVE NEGATIVE   Ketones, ur NEGATIVE NEGATIVE mg/dL   Protein, ur NEGATIVE NEGATIVE mg/dL   Nitrite NEGATIVE NEGATIVE   Leukocytes, UA NEGATIVE NEGATIVE  Pregnancy, urine POC     Status: Abnormal   Collection Time: 12/30/14  9:11 PM  Result Value Ref Range   Preg Test, Ur POSITIVE (A) NEGATIVE    MAU Course  Procedures None  MDM +UPT UA today shows mild dehydration IV LR bolus with 10 mg Reglan IV given Patient  is driving today Patient reports improvement in nausea. No emesis while in MAU.   Assessment and Plan  A: Positive pregnancy test Nausea without vomiting  P: Discharge home Rx for Reglan and Phenergan given to patient First trimester/ectopic precautions discussed Pregnancy confirmation letter given Patient advised to follow-up with [facility] to start prenatal care as planned Patient may return to MAU as needed or if her condition were to change or worsen  Marny Lowenstein, PA-C  12/31/2014, 12:15 AM

## 2015-01-18 NOTE — L&D Delivery Note (Signed)
Delivery Note At 2:35 AM a viable female was delivered via  (Presentation:OA).  APGAR: 9,9 Placenta status: delivered spontaneously intact Cord: without complications  Anesthesia: Lidocaine for repair   Episiotomy: NA  Lacerations: 1st degree perineal   Suture Repair: 3.0 vicryl Est. Blood Loss (mL):  150  Mom to postpartum.  Baby to Couplet care / Skin to Skin. All instruments and gauze accounted for and sharps disposed of.  Andres Ege, MD, PGY-1, MPH 08/21/2015, 3:06 AM  I was present for the above and agree. Pt's labor was precipitous (<1 hour) and she did not receive GBS prophylaxis

## 2015-02-09 ENCOUNTER — Other Ambulatory Visit (HOSPITAL_COMMUNITY): Payer: Self-pay | Admitting: Nurse Practitioner

## 2015-02-09 DIAGNOSIS — Z369 Encounter for antenatal screening, unspecified: Secondary | ICD-10-CM

## 2015-02-09 LAB — OB RESULTS CONSOLE RPR: RPR: NONREACTIVE

## 2015-02-09 LAB — OB RESULTS CONSOLE HEPATITIS B SURFACE ANTIGEN: HEP B S AG: POSITIVE

## 2015-02-09 LAB — OB RESULTS CONSOLE RUBELLA ANTIBODY, IGM: RUBELLA: IMMUNE

## 2015-02-09 LAB — OB RESULTS CONSOLE GC/CHLAMYDIA
CHLAMYDIA, DNA PROBE: NEGATIVE
GC PROBE AMP, GENITAL: NEGATIVE

## 2015-02-09 LAB — OB RESULTS CONSOLE HIV ANTIBODY (ROUTINE TESTING): HIV: NONREACTIVE

## 2015-02-11 ENCOUNTER — Encounter (HOSPITAL_COMMUNITY): Payer: Self-pay | Admitting: Nurse Practitioner

## 2015-02-19 ENCOUNTER — Ambulatory Visit (HOSPITAL_COMMUNITY)
Admission: RE | Admit: 2015-02-19 | Discharge: 2015-02-19 | Disposition: A | Discharge status: Home / Self Care | Payer: Medicaid Other | Source: Ambulatory Visit | Attending: Nurse Practitioner | Admitting: Nurse Practitioner

## 2015-02-19 ENCOUNTER — Ambulatory Visit (HOSPITAL_COMMUNITY): Payer: Self-pay

## 2015-02-19 ENCOUNTER — Other Ambulatory Visit (HOSPITAL_COMMUNITY): Payer: Self-pay | Admitting: Nurse Practitioner

## 2015-02-19 ENCOUNTER — Encounter (HOSPITAL_COMMUNITY): Payer: Self-pay

## 2015-02-19 DIAGNOSIS — Z3A13 13 weeks gestation of pregnancy: Secondary | ICD-10-CM

## 2015-02-19 DIAGNOSIS — O09522 Supervision of elderly multigravida, second trimester: Secondary | ICD-10-CM

## 2015-02-19 DIAGNOSIS — Z315 Encounter for genetic counseling: Secondary | ICD-10-CM | POA: Insufficient documentation

## 2015-02-19 DIAGNOSIS — Z369 Encounter for antenatal screening, unspecified: Secondary | ICD-10-CM

## 2015-02-19 DIAGNOSIS — O09521 Supervision of elderly multigravida, first trimester: Secondary | ICD-10-CM

## 2015-02-19 DIAGNOSIS — O09529 Supervision of elderly multigravida, unspecified trimester: Secondary | ICD-10-CM | POA: Insufficient documentation

## 2015-02-19 NOTE — Progress Notes (Signed)
Genetic Counseling  High-Risk Gestation Note  Appointment Date:  02/19/2015 Referred By: Sherlon Handing, * Date of Birth:  1980/09/16   Pregnancy History: [id number] Estimated Date of Delivery: 08/22/15 Estimated Gestational Age: [redacted]w[redacted]d Attending: Alpha Gula, MD  Ms. Laelynn Tamargo was seen for genetic counseling because of a maternal age of 35 y.o..  She will be 35 years old at delivery.   In Summary:   1 in 114 risk for fetal aneuploidy related to maternal age 23 years old at delivery  Nuchal translucency ultrasound performed today  Patient elected to proceed with NIPS (Panorama) declined amniocentesis   Detailed ultrasound scheduled for 03/24/15  Family history significant for birth defect for patient's sister; see detailed discussion below  She was counseled regarding maternal age and the association with risk for chromosome conditions due to nondisjunction with aging of the ova.  We reviewed chromosomes, nondisjunction, and the associated 1 in 114 risk for fetal aneuploidy related to a maternal age of 35 y.o. at [redacted]w[redacted]d gestation. She was counseled that the risk for aneuploidy decreases as gestational age increases, accounting for those pregnancies which spontaneously abort.  We specifically discussed Down syndrome (trisomy 74), trisomies 11 and 49, and sex chromosome aneuploidies (47,XXX and 47,XXY) including the common features and prognoses of each.   We reviewed available screening options including First Screen, Quad screen, noninvasive prenatal screening (NIPS)/cell free DNA (cfDNA) testing, and detailed ultrasound.  She was counseled that screening tests are used to modify a patient's a priori risk for aneuploidy, typically based on age. This estimate provides a pregnancy specific risk assessment. We reviewed the benefits and limitations of each option. Specifically, we discussed the conditions for which each test screens, the detection rates, and false positive rates of  each. She was also counseled regarding diagnostic testing via amniocentesis. We reviewed the approximate 1 in 300-500 risk for complications for amniocentesis, including spontaneous pregnancy loss. After consideration of all the options, she elected to proceed with NIPS (Panorama through [facility]).  Those results will be available in 8-10 days.  She declined amniocentesis given the associated risk for complications.   A nuchal translucency ultrasound was performed today. The report will be documented separately. Detailed ultrasound is scheduled for 03/24/15. We discussed that screening tests cannot rule out all birth defects or genetic syndromes. The patient was advised of this limitation and states she still does not want additional testing at this time.   Ms. Braylea Brancato was provided with written information regarding sickle cell anemia (SCA) including the carrier frequency and incidence in the African population, the availability of carrier testing and prenatal diagnosis if indicated.  In addition, we discussed that hemoglobinopathies are routinely screened for as part of the [facility] newborn screening panel.  Hemoglobin electrophoresis is available to her if desired and if not previously performed.  Both family histories were reviewed and found to be contributory for a sister to the patient born with a hump in the middle of her back. It was unclear if it was a skin covered hump or an open neural tube defect. The patient reported that her sister had surgery for this and was never able to walk or talk. She reported that her sister died at age 39 years old. We discussed that some of the features described could fit with an open neural tube defect, which are often isolated and multifactorial but can be part of underlying genetic conditions. Alternatively, her sister's medical features could be due to a different underlying cause. We  discussed that recurrence risk assessment for relatives is difficult  without additional information regarding the specific name of her sister's condition and the underlying etiology.   The patient also reported possible consanguinity for herself and her husband but was unable to describe the exact relation. She reported that her husband's mother calls Ms. Dewan's father "uncle" but that the patient's father calls Ms. Kangas's husband's mother "auntie." She could not provide more details on the possible blood relation between the families. We discussed that children born to a consanguineous couple are at increased risk for genetic health problems. This increase in risk is related to the possibility of passing on recessive genes. We explained that every person carries approximately 7-10 non-working genes that when received in a double dose results in recessive genetic conditions. In general, unrelated couples have a relatively low risk of having a child with a recessive condition because the likelihood of both parents carrying the same non-working recessive gene is very low.  However, when a couple is related, they have inherited some of their genetic information from the same family member, which leads to an increased chance that they may carry the same recessive gene and have a child with a recessive condition. The chance for a recessive or multifactorial condition is lower with further degrees of relation. We reviewed that we cannot accurately assess the possible chance of consanguinity without additional information regarding the relation. However, the patient and her partner have three previous healthy children, and they both reportedly have large, relatively healthy families. Without further information regarding the provided family history, an accurate genetic risk cannot be calculated. Further genetic counseling is warranted if more information is obtained.  Ms. Vanesha Athens denied exposure to environmental toxins or chemical agents. She denied the use of alcohol, tobacco or  street drugs. She denied significant viral illnesses during the course of her pregnancy. Her medical and surgical histories were noncontributory.   I counseled Ms. Ileen Leask regarding the above risks and available options.  The approximate face-to-face time with the genetic counselor was 40 minutes.  Quinn Plowman, MS,  Certified Genetic Counselor 02/19/2015

## 2015-02-26 ENCOUNTER — Telehealth (HOSPITAL_COMMUNITY): Payer: Self-pay | Admitting: MS"

## 2015-02-26 NOTE — Telephone Encounter (Signed)
Called Katelyn Webb to discuss her prenatal cell free DNA test results.  Mrs. Katelyn Webb had Panorama testing through [facility].  Testing was offered because of maternal age.   The patient was identified by name and DOB.  We reviewed that these are within normal limits, showing a less than 1 in 10,000 risk for trisomies 21, 18 and 13, and monosomy X (Turner syndrome).  In addition, the risk for triploidy/vanishing twin and sex chromosome trisomies (47,XXX and 47,XXY) was also low risk.  We reviewed that this testing identifies > 99% of pregnancies with trisomy 75, trisomy 20, sex chromosome trisomies (47,XXX and 47,XXY), and triploidy. The detection rate for trisomy 18 is 96%.  The detection rate for monosomy X is ~92%.  The false positive rate is <0.1% for all conditions. Testing was also consistent with female fetal sex.  The patient did wish to know fetal sex.  She understands that this testing does not identify all genetic conditions.  All questions were answered to her satisfaction, she was encouraged to call with additional questions or concerns.  Quinn Plowman, MS Certified Genetic Counselor 02/26/2015 9:17 AM

## 2015-02-26 NOTE — Telephone Encounter (Signed)
Attempted to call patient regarding results of prenatal cell free DNA testing, which are within normal limits. Left message for patient to return call.   Katelyn Webb [name] 02/26/2015 9:06 AM

## 2015-03-03 ENCOUNTER — Other Ambulatory Visit (HOSPITAL_COMMUNITY): Payer: Self-pay

## 2015-03-24 ENCOUNTER — Ambulatory Visit (HOSPITAL_COMMUNITY)
Admission: RE | Admit: 2015-03-24 | Discharge: 2015-03-24 | Disposition: A | Discharge status: Home / Self Care | Payer: Medicaid Other | Source: Ambulatory Visit | Attending: Maternal and Fetal Medicine | Admitting: Maternal and Fetal Medicine

## 2015-03-24 ENCOUNTER — Encounter (HOSPITAL_COMMUNITY): Payer: Self-pay

## 2015-03-24 ENCOUNTER — Other Ambulatory Visit (HOSPITAL_COMMUNITY): Payer: Self-pay | Admitting: Maternal and Fetal Medicine

## 2015-03-24 ENCOUNTER — Other Ambulatory Visit (HOSPITAL_COMMUNITY): Payer: Medicaid Other

## 2015-03-24 DIAGNOSIS — Z3A18 18 weeks gestation of pregnancy: Secondary | ICD-10-CM

## 2015-03-24 DIAGNOSIS — O09522 Supervision of elderly multigravida, second trimester: Secondary | ICD-10-CM

## 2015-03-24 DIAGNOSIS — Z3689 Encounter for other specified antenatal screening: Secondary | ICD-10-CM

## 2015-03-24 DIAGNOSIS — Z36 Encounter for antenatal screening of mother: Secondary | ICD-10-CM | POA: Diagnosis present

## 2015-04-29 IMAGING — CT CT ABD-PELV W/O CM
2 of 4 series · 16 of 46 positions shown, 18 images · non-contrast
Comparison: No similar prior exam is available at this institution
for comparison or on [HOSPITAL] PACS.

CLINICAL DATA: Back pain, recurrent urinary tract infection

EXAM:
CT ABDOMEN AND PELVIS WITHOUT CONTRAST
TECHNIQUE: Multidetector CT imaging of the abdomen and pelvis was performed
following the standard protocol without IV contrast.

[Series 2: stone study 5.0 i30f 1 · axial · 0.66mm/px · z∈[+232,+642]mm · 13 of 90 slices shown, 15 images]
[im 4/90  soft-tissue]
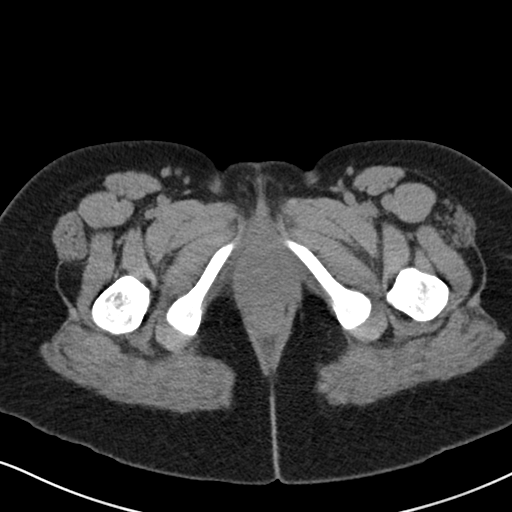
[im 4/90  bone]
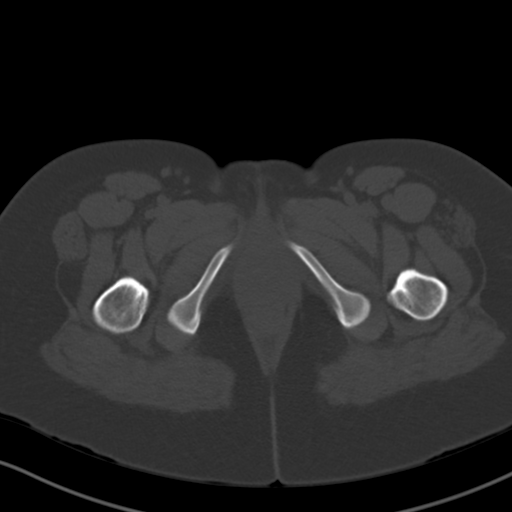
[im 11/90  soft-tissue]
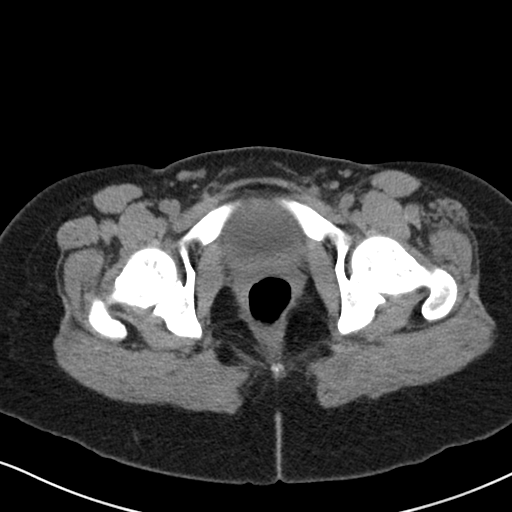
[im 18/90  soft-tissue]
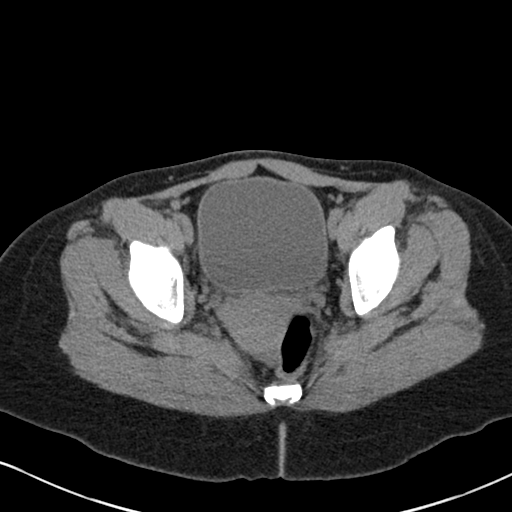
[im 24/90  soft-tissue]
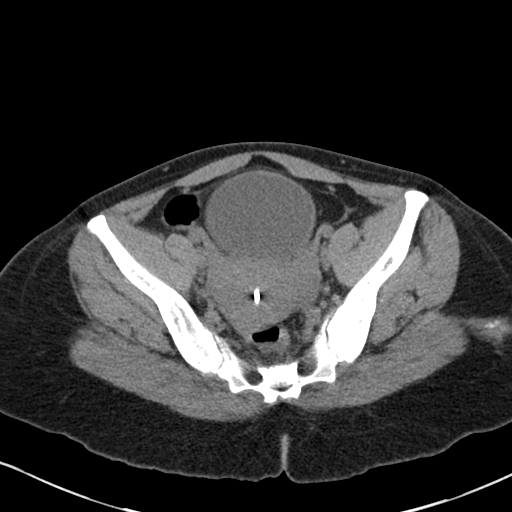
[im 31/90  soft-tissue]
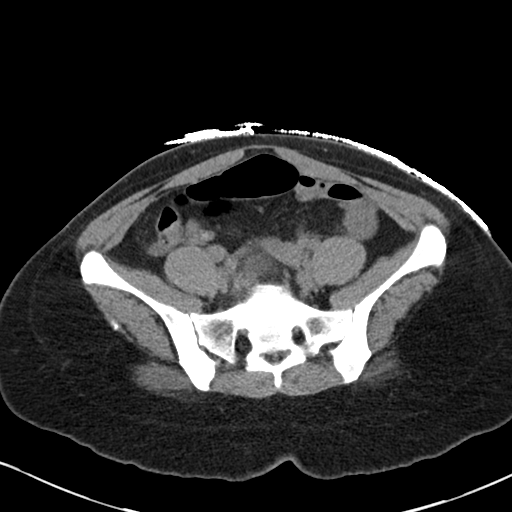
[im 38/90  soft-tissue]
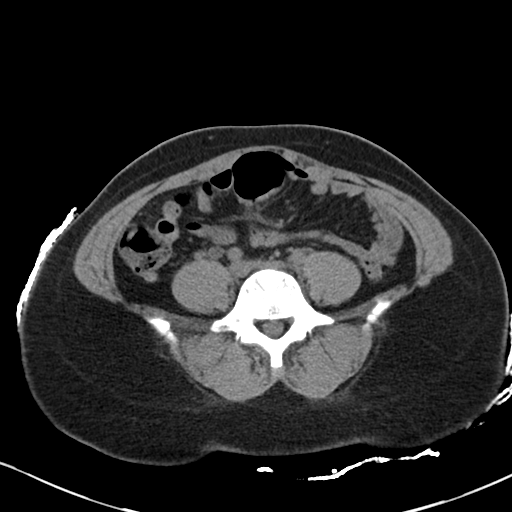
[im 45/90  soft-tissue]
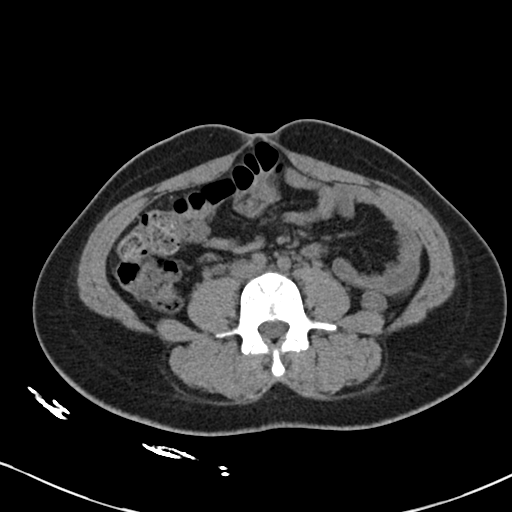
[im 52/90  soft-tissue]
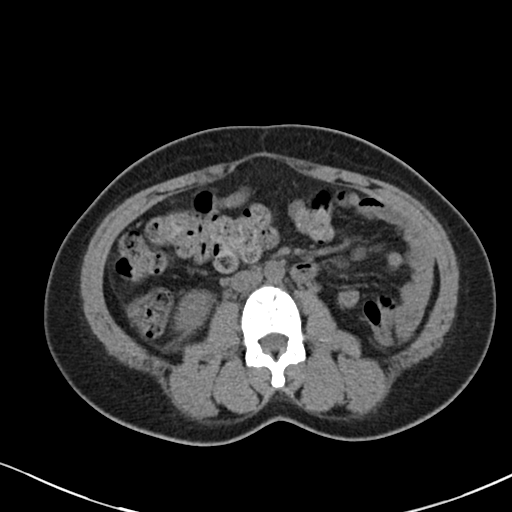
[im 59/90  soft-tissue]
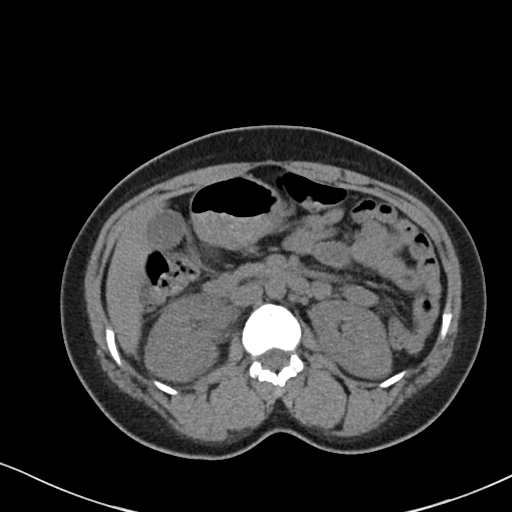
[im 59/90  bone]
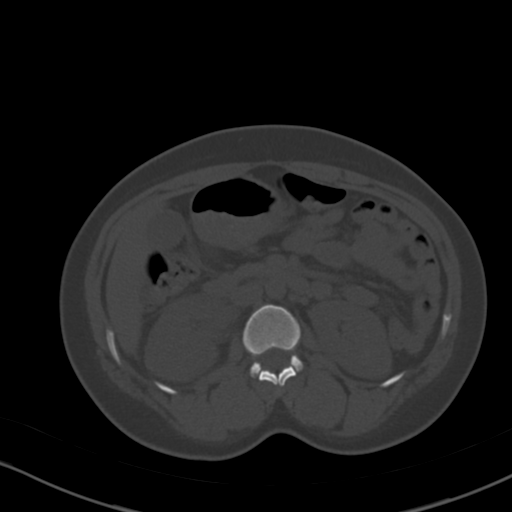
[im 66/90  soft-tissue]
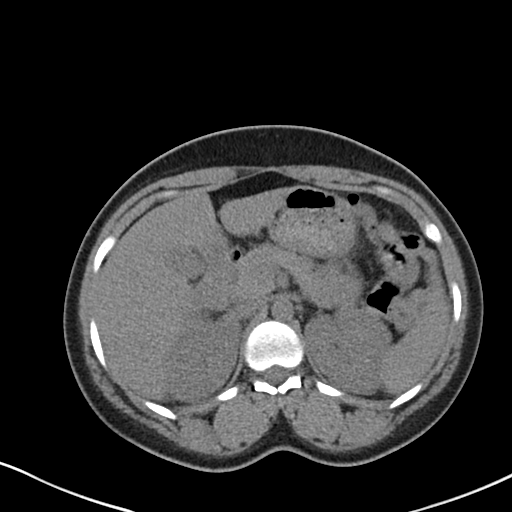
[im 72/90  soft-tissue]
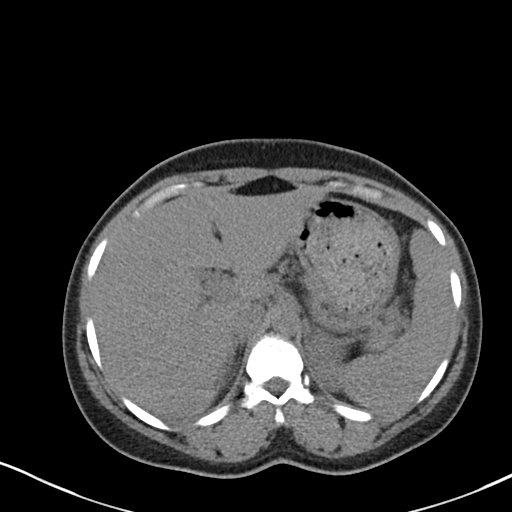
[im 79/90  soft-tissue]
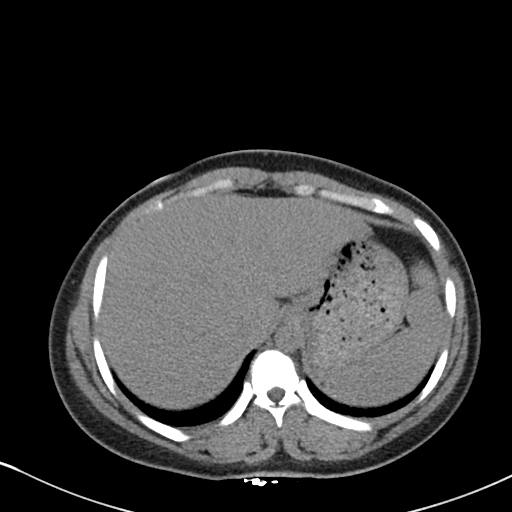
[im 86/90  soft-tissue]
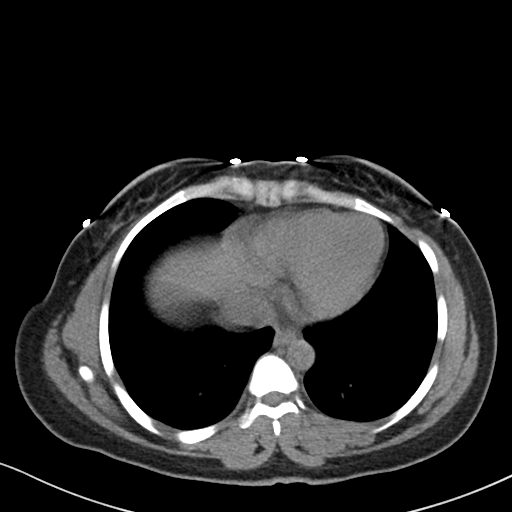

[Series 5: coronal soft tissue · coronal · 0.64mm/px · 3 of 71 slices shown]
[im 24/71  soft-tissue]
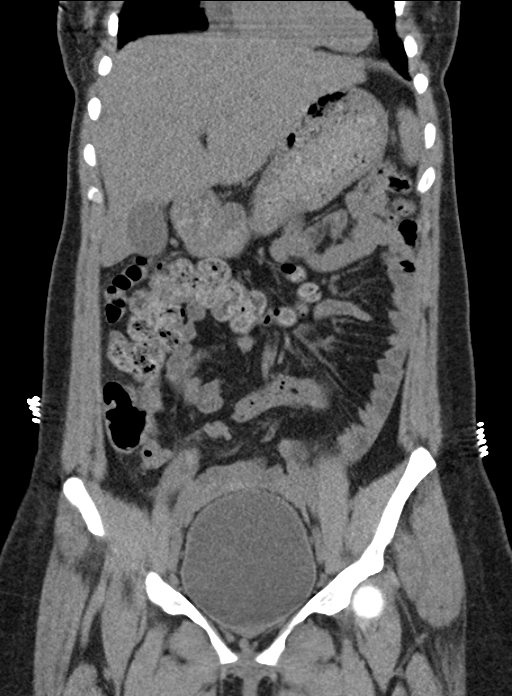
[im 32/71  soft-tissue]
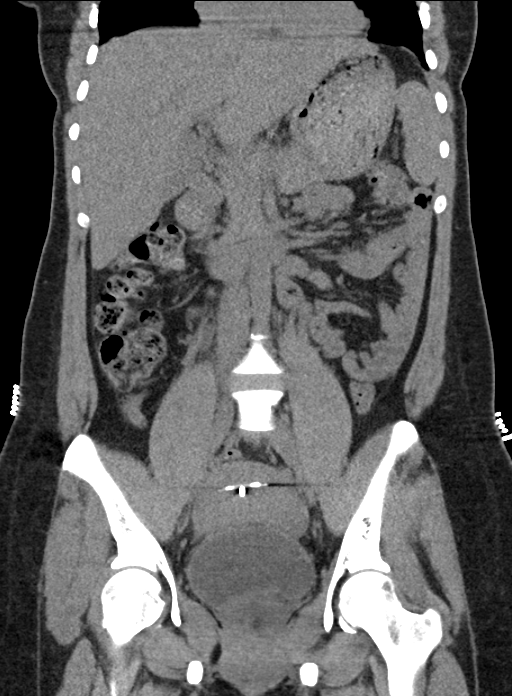
[im 39/71  soft-tissue]
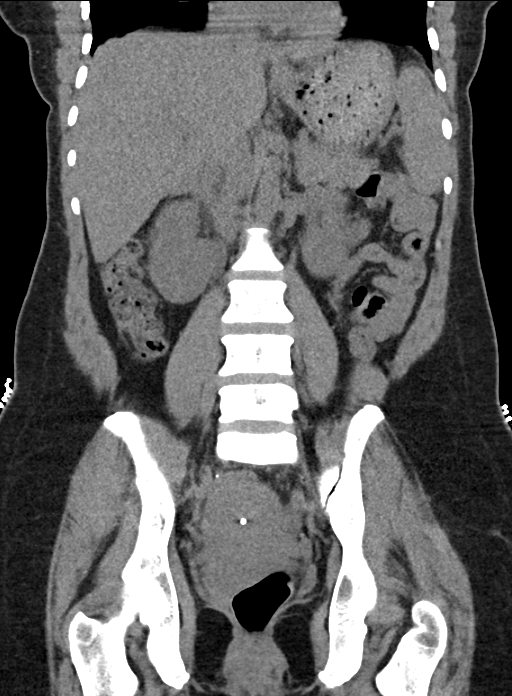

[16 of 46 positions shown; findings below may reference images not displayed]

FINDINGS: Lung bases are clear.

There is mild right hydroureteronephrosis visualized to the level of
the ureter crossing over the iliac vessels with apparent distal
decompression. No radiopaque renal or ureteral calculus is
identified. There is trace right perinephric stranding. Mild right
urothelial subjective prominence is identified. Unenhanced liver,
gallbladder, spleen, pancreas, and adrenal glands are normal. Left
kidney appears normal.

Appendix is normal. No bowel wall thickening or focal segmental
dilatation. Uterus and ovaries appear normal. IUD in place,
appropriately located by CT. Trace free pelvic fluid is likely
physiologic. Bladder is normal.

No acute osseous abnormality.
IMPRESSION: Mild right hydroureteronephrosis to the level of the ureteral
crossover at the iliac vessels of the pelvic inlet. Imaging findings
may be due to a radiolucent stone, recently passed stone, or
pyelonephritis. Further evaluation of the renal parenchyma for
possible complication of pyelonephritis could include renal
ultrasound, or CT abdomen/pelvis with IV contrast.

## 2015-06-16 ENCOUNTER — Encounter (HOSPITAL_COMMUNITY): Payer: Self-pay

## 2015-06-16 ENCOUNTER — Inpatient Hospital Stay (HOSPITAL_COMMUNITY)
Admission: AD | Admit: 2015-06-16 | Discharge: 2015-06-16 | Disposition: A | Discharge status: Home / Self Care | Payer: Medicaid Other | Source: Ambulatory Visit | Attending: Obstetrics and Gynecology | Admitting: Obstetrics and Gynecology

## 2015-06-16 DIAGNOSIS — R112 Nausea with vomiting, unspecified: Secondary | ICD-10-CM | POA: Diagnosis present

## 2015-06-16 DIAGNOSIS — O212 Late vomiting of pregnancy: Secondary | ICD-10-CM | POA: Insufficient documentation

## 2015-06-16 DIAGNOSIS — Z3A30 30 weeks gestation of pregnancy: Secondary | ICD-10-CM | POA: Insufficient documentation

## 2015-06-16 DIAGNOSIS — R8271 Bacteriuria: Secondary | ICD-10-CM

## 2015-06-16 DIAGNOSIS — O99019 Anemia complicating pregnancy, unspecified trimester: Secondary | ICD-10-CM

## 2015-06-16 DIAGNOSIS — K529 Noninfective gastroenteritis and colitis, unspecified: Secondary | ICD-10-CM | POA: Diagnosis not present

## 2015-06-16 HISTORY — DX: Iron deficiency: E61.1

## 2015-06-16 LAB — CBC WITH DIFFERENTIAL/PLATELET
BASOS ABS: 0 10*3/uL (ref 0.0–0.1)
BASOS PCT: 0 %
Eosinophils Absolute: 0 10*3/uL (ref 0.0–0.7)
Eosinophils Relative: 1 %
HEMATOCRIT: 27.5 % — AB (ref 36.0–46.0)
HEMOGLOBIN: 8.7 g/dL — AB (ref 12.0–15.0)
Lymphocytes Relative: 24 %
Lymphs Abs: 1.3 10*3/uL (ref 0.7–4.0)
MCH: 23.7 pg — ABNORMAL LOW (ref 26.0–34.0)
MCHC: 31.6 g/dL (ref 30.0–36.0)
MCV: 74.9 fL — ABNORMAL LOW (ref 78.0–100.0)
Monocytes Absolute: 0.2 10*3/uL (ref 0.1–1.0)
Monocytes Relative: 4 %
NEUTROS ABS: 3.8 10*3/uL (ref 1.7–7.7)
NEUTROS PCT: 71 %
Platelets: 153 10*3/uL (ref 150–400)
RBC: 3.67 MIL/uL — AB (ref 3.87–5.11)
RDW: 15.1 % (ref 11.5–15.5)
WBC: 5.4 10*3/uL (ref 4.0–10.5)

## 2015-06-16 LAB — URINALYSIS, ROUTINE W REFLEX MICROSCOPIC
Bilirubin Urine: NEGATIVE
Glucose, UA: NEGATIVE mg/dL
Hgb urine dipstick: NEGATIVE
Ketones, ur: NEGATIVE mg/dL
NITRITE: NEGATIVE
Protein, ur: NEGATIVE mg/dL
Specific Gravity, Urine: 1.02 (ref 1.005–1.030)
pH: 7 (ref 5.0–8.0)

## 2015-06-16 LAB — URINE MICROSCOPIC-ADD ON

## 2015-06-16 LAB — COMPREHENSIVE METABOLIC PANEL
ALT: 9 U/L — ABNORMAL LOW (ref 14–54)
ANION GAP: 6 (ref 5–15)
AST: 16 U/L (ref 15–41)
Albumin: 2.8 g/dL — ABNORMAL LOW (ref 3.5–5.0)
Alkaline Phosphatase: 79 U/L (ref 38–126)
BILIRUBIN TOTAL: 0.3 mg/dL (ref 0.3–1.2)
BUN: 5 mg/dL — ABNORMAL LOW (ref 6–20)
CHLORIDE: 108 mmol/L (ref 101–111)
CO2: 22 mmol/L (ref 22–32)
Calcium: 8.4 mg/dL — ABNORMAL LOW (ref 8.9–10.3)
Creatinine, Ser: 0.49 mg/dL (ref 0.44–1.00)
GFR calc Af Amer: >60 mL/min (ref >60)
Glucose, Bld: 86 mg/dL (ref 65–99)
POTASSIUM: 3.5 mmol/L (ref 3.5–5.1)
Sodium: 136 mmol/L (ref 135–145)
TOTAL PROTEIN: 6 g/dL — AB (ref 6.5–8.1)

## 2015-06-16 LAB — LIPASE, BLOOD: LIPASE: 19 U/L (ref 11–51)

## 2015-06-16 MED ORDER — SODIUM CHLORIDE 0.9 % IV SOLN
8.0000 mg | Freq: Once | INTRAVENOUS | Status: AC
  Administered 2015-06-16: 8 mg via INTRAVENOUS
  Filled 2015-06-16: qty 4

## 2015-06-16 MED ORDER — PROMETHAZINE HCL 25 MG PO TABS: 25.0000 mg | ORAL_TABLET | Freq: Four times a day (QID) | ORAL | Status: DC | PRN

## 2015-06-16 MED ORDER — FERROUS SULFATE 325 (65 FE) MG PO TABS: 325.0000 mg | ORAL_TABLET | Freq: Two times a day (BID) | ORAL | Status: DC

## 2015-06-16 MED ORDER — LACTATED RINGERS IV BOLUS (SEPSIS)
1000.0000 mL | Freq: Once | INTRAVENOUS | Status: AC
  Administered 2015-06-16: 1000 mL via INTRAVENOUS

## 2015-06-16 MED ORDER — PROMETHAZINE HCL 25 MG PO TABS
25.0000 mg | ORAL_TABLET | Freq: Once | ORAL | Status: AC
  Administered 2015-06-16: 25 mg via ORAL
  Filled 2015-06-16: qty 1

## 2015-06-16 NOTE — MAU Note (Signed)
Nausea and vomiting started yesterday.  Rash/itching has been getting worse during the last few weeks.  Has a cream for the rash, but it's not helping.

## 2015-06-16 NOTE — Discharge Instructions (Signed)
Pregnancy and Anemia Anemia is a condition in which the concentration of red blood cells or hemoglobin in the blood is below normal. Hemoglobin is a substance in red blood cells that carries oxygen to the tissues of the body. Anemia results in not enough oxygen reaching these tissues.  Anemia during pregnancy is common because the fetus uses more iron and folic acid as it is developing. Your body may not produce enough red blood cells because of this. Also, during pregnancy, the liquid part of the blood (plasma) increases by about 50%, and the red blood cells increase by only 25%. This lowers the concentration of the red blood cells and creates a natural anemia-like situation.  CAUSES  The most common cause of anemia during pregnancy is not having enough iron in the body to make red blood cells (iron deficiency anemia). Other causes may include:  Folic acid deficiency.  Vitamin B12 deficiency.  Certain prescription or over-the-counter medicines.  Certain medical conditions or infections that destroy red blood cells.  A low platelet count and bleeding caused by antibodies that go through the placenta to the fetus from the mother's blood. SIGNS AND SYMPTOMS  Mild anemia may not be noticeable. If it becomes severe, symptoms may include:  Tiredness.  Shortness of breath, especially with exercise.  Weakness.  Fainting.  Pale looking skin.  Headaches.  Feeling a fast or irregular heartbeat (palpitations). DIAGNOSIS  The type of anemia is usually diagnosed from your family and medical history and blood tests. TREATMENT  Treatment of anemia during pregnancy depends on the cause of the anemia. Treatment can include:  Supplements of iron, vitamin B12, or folic acid.  A blood transfusion. This may be needed if blood loss is severe.  Hospitalization. This may be needed if there is significant continual blood loss.  Dietary changes. HOME CARE INSTRUCTIONS   Follow your dietitian's or  health care provider's dietary recommendations.  Increase your vitamin C intake. This will help the stomach absorb more iron.  Eat a diet rich in iron. This would include foods such as:  Liver.  Beef.  Whole grain bread.  Eggs.  Dried fruit.  Take iron and vitamins as directed by your health care provider.  Eat green leafy vegetables. These are a good source of folic acid. SEEK MEDICAL CARE IF:   You have frequent or lasting headaches.  You are looking pale.  You are bruising easily. SEEK IMMEDIATE MEDICAL CARE IF:   You have extreme weakness, shortness of breath, or chest pain.  You become dizzy or have trouble concentrating.  You have heavy vaginal bleeding.  You develop a rash.  You have bloody or black, tarry stools.  You faint.  You vomit up blood.  You vomit repeatedly.  You have abdominal pain.  You have a fever or persistent symptoms for more than 2-3 days.  You have a fever and your symptoms suddenly get worse.  You are dehydrated. MAKE SURE YOU:   Understand these instructions.  Will watch your condition.  Will get help right away if you are not doing well or get worse.   This information is not intended to replace advice given to you by your health care provider. Make sure you discuss any questions you have with your health care provider.   Document Released: 01/01/2000 Document Revised: 10/24/2012 Document Reviewed: 08/15/2012 Elsevier Interactive Patient Education 2016 Elsevier Inc. Viral Gastroenteritis Viral gastroenteritis is also known as stomach flu. This condition affects the stomach and intestinal tract. It  can cause sudden diarrhea and vomiting. The illness typically lasts 3 to 8 days. Most people develop an immune response that eventually gets rid of the virus. While this natural response develops, the virus can make you quite ill. CAUSES  Many different viruses can cause gastroenteritis, such as rotavirus or noroviruses. You  can catch one of these viruses by consuming contaminated food or water. You may also catch a virus by sharing utensils or other personal items with an infected person or by touching a contaminated surface. SYMPTOMS  The most common symptoms are diarrhea and vomiting. These problems can cause a severe loss of body fluids (dehydration) and a body salt (electrolyte) imbalance. Other symptoms may include:  Fever.  Headache.  Fatigue.  Abdominal pain. DIAGNOSIS  Your caregiver can usually diagnose viral gastroenteritis based on your symptoms and a physical exam. A stool sample may also be taken to test for the presence of viruses or other infections. TREATMENT  This illness typically goes away on its own. Treatments are aimed at rehydration. The most serious cases of viral gastroenteritis involve vomiting so severely that you are not able to keep fluids down. In these cases, fluids must be given through an intravenous line (IV). HOME CARE INSTRUCTIONS   Drink enough fluids to keep your urine clear or pale yellow. Drink small amounts of fluids frequently and increase the amounts as tolerated.  Ask your caregiver for specific rehydration instructions.  Avoid:  Foods high in sugar.  Alcohol.  Carbonated drinks.  Tobacco.  Juice.  Caffeine drinks.  Extremely hot or cold fluids.  Fatty, greasy foods.  Too much intake of anything at one time.  Dairy products until 24 to 48 hours after diarrhea stops.  You may consume probiotics. Probiotics are active cultures of beneficial bacteria. They may lessen the amount and number of diarrheal stools in adults. Probiotics can be found in yogurt with active cultures and in supplements.  Wash your hands well to avoid spreading the virus.  Only take over-the-counter or prescription medicines for pain, discomfort, or fever as directed by your caregiver. Do not give aspirin to children. Antidiarrheal medicines are not recommended.  Ask your  caregiver if you should continue to take your regular prescribed and over-the-counter medicines.  Keep all follow-up appointments as directed by your caregiver. SEEK IMMEDIATE MEDICAL CARE IF:   You are unable to keep fluids down.  You do not urinate at least once every 6 to 8 hours.  You develop shortness of breath.  You notice blood in your stool or vomit. This may look like coffee grounds.  You have abdominal pain that increases or is concentrated in one small area (localized).  You have persistent vomiting or diarrhea.  You have a fever.  The patient is a child younger than 3 months, and he or she has a fever.  The patient is a child older than 3 months, and he or she has a fever and persistent symptoms.  The patient is a child older than 3 months, and he or she has a fever and symptoms suddenly get worse.  The patient is a baby, and he or she has no tears when crying. MAKE SURE YOU:   Understand these instructions.  Will watch your condition.  Will get help right away if you are not doing well or get worse.   This information is not intended to replace advice given to you by your health care provider. Make sure you discuss any questions you have with  your health care provider.   Document Released: 01/03/2005 Document Revised: 03/28/2011 Document Reviewed: 10/20/2010 Elsevier Interactive Patient Education [employer] Elsevier Inc.

## 2015-06-16 NOTE — Progress Notes (Signed)
PO Challenge complete.  Pt tolerated crackers and water.

## 2015-06-16 NOTE — MAU Provider Note (Signed)
MAU HISTORY AND PHYSICAL  Chief Complaint:  n/v  Katelyn Webb is a 35 y.o.  [phone] with IUP at [redacted]w[redacted]d presenting for n/v  Vomiting began yesterday morning. Still vomiting and spitting. Two episodes loose stools today. No n/v for a few months prior to this. No blood or significant mucous in stool. Tolerating liquids, urinating normally. No abdominal pain, no dysuria or fevers. No vaginal bleeding, no leakage of fluid, no contractions. CNA so around ill people. No home sick contacts.   Past Medical History  Diagnosis Date  . Hepatitis Webb   . Low iron     History reviewed. No pertinent past surgical history.  Family History  Problem Relation Age of Onset  . Anesthesia problems Neg Hx   . Hypotension Neg Hx   . Malignant hyperthermia Neg Hx   . Pseudochol deficiency Neg Hx     Social History  Substance Use Topics  . Smoking status: Never Smoker   . Smokeless tobacco: Never Used  . Alcohol Use: No    No Known Allergies  Prescriptions prior to admission  Medication Sig Dispense Refill Last Dose  . ketoconazole (NIZORAL) 2 % cream APPLY A VERY  SMALL AMOUNT TO AFFECTED AREA DAILY  0 Past Week at Unknown time  . OVER THE COUNTER MEDICATION Take 1 tablet by mouth at bedtime as needed.   06/15/2015 at Unknown time  . Prenatal Vit-Fe Fumarate-FA (PNV PRENATAL PLUS MULTIVITAMIN) 27-1 MG TABS Take 1 tablet by mouth daily.  0 Past Week at Unknown time    Review of Systems - Negative except for what is mentioned in HPI.  Physical Exam  Blood pressure 113/69, pulse 103, temperature 98.2 F (36.8 C), temperature source Oral, resp. rate 16, height  (1.6 m), weight 163 lb 9.6 oz (74.208 kg), last menstrual period 11/15/2014. GENERAL: Well-developed, well-nourished female in no acute distress.  LUNGS: Clear to auscultation bilaterally.  HEART: Regular rate and rhythm. ABDOMEN: Soft, nontender, nondistended, gravid.  EXTREMITIES: Nontender, no edema, 2+ distal pulses. FHT:   140/mod/+a/-d Contractions: infrequent/occasional   Labs: Results for orders placed or performed during the hospital encounter of 06/16/15 (from the past 24 hour(s))  Urinalysis, Routine w reflex microscopic (not at [facility])   Collection Time: 06/16/15  9:15 AM  Result Value Ref Range   Color, Urine YELLOW YELLOW   APPearance CLEAR CLEAR   Specific Gravity, Urine 1.020 1.005 - 1.030   pH 7.0 5.0 - 8.0   Glucose, UA NEGATIVE NEGATIVE mg/dL   Hgb urine dipstick NEGATIVE NEGATIVE   Bilirubin Urine NEGATIVE NEGATIVE   Ketones, ur NEGATIVE NEGATIVE mg/dL   Protein, ur NEGATIVE NEGATIVE mg/dL   Nitrite NEGATIVE NEGATIVE   Leukocytes, UA MODERATE (A) NEGATIVE  Urine microscopic-add on   Collection Time: 06/16/15  9:15 AM  Result Value Ref Range   Squamous Epithelial / LPF 6-30 (A) NONE SEEN   WBC, UA 6-30 0 - 5 WBC/hpf   RBC / HPF 0-5 0 - 5 RBC/hpf   Bacteria, UA MANY (A) NONE SEEN   Urine-Other MUCOUS PRESENT   Comprehensive metabolic panel   Collection Time: 06/16/15 10:12 AM  Result Value Ref Range   Sodium 136 135 - 145 mmol/L   Potassium 3.5 3.5 - 5.1 mmol/L   Chloride 108 101 - 111 mmol/L   CO2 22 22 - 32 mmol/L   Glucose, Bld 86 65 - 99 mg/dL   BUN 5 (L) 6 - 20 mg/dL   Creatinine, Ser 8.29 0.44 -  1.00 mg/dL   Calcium 8.4 (L) 8.9 - 10.3 mg/dL   Total Protein 6.0 (L) 6.5 - 8.1 g/dL   Albumin 2.8 (L) 3.5 - 5.0 g/dL   AST 16 15 - 41 U/L   ALT 9 (L) 14 - 54 U/L   Alkaline Phosphatase 79 38 - 126 U/L   Total Bilirubin 0.3 0.3 - 1.2 mg/dL   GFR calc non Af Amer >60 >60 mL/min   GFR calc Af Amer >60 >60 mL/min   Anion gap 6 5 - 15  Lipase, blood   Collection Time: 06/16/15 10:12 AM  Result Value Ref Range   Lipase 19 11 - 51 U/L  CBC with Differential/Platelet   Collection Time: 06/16/15 10:12 AM  Result Value Ref Range   WBC 5.4 4.0 - 10.5 K/uL   RBC 3.67 (L) 3.87 - 5.11 MIL/uL   Hemoglobin 8.7 (L) 12.0 - 15.0 g/dL   HCT 16.127.5 (L) 09.636.0 - 04.546.0 %   MCV 74.9 (L) 78.0 -  100.0 fL   MCH 23.7 (L) 26.0 - 34.0 pg   MCHC 31.6 30.0 - 36.0 g/dL   RDW 40.915.1 81.111.5 - 91.415.5 %   Platelets 153 150 - 400 K/uL   Neutrophils Relative % 71 %   Neutro Abs 3.8 1.7 - 7.7 K/uL   Lymphocytes Relative 24 %   Lymphs Abs 1.3 0.7 - 4.0 K/uL   Monocytes Relative 4 %   Monocytes Absolute 0.2 0.1 - 1.0 K/uL   Eosinophils Relative 1 %   Eosinophils Absolute 0.0 0.0 - 0.7 K/uL   Basophils Relative 0 %   Basophils Absolute 0.0 0.0 - 0.1 K/uL    Imaging Studies:  No results found.  Assessment: Katelyn Webb is  35 y.o. [phone] [id number] at 653w3d presents with vomiting. Here well appearing, does not appear dehydrated. Urinalysis not suggestive of dehydration. Electrolytes and kidney function wnl. Tolerating PO. No abdominal pain or infectious symptoms. No red flag symptoms. Labs unremarkable - normal white count, no hepatopancreatobiliary findings on cmp and lipase. Here hydrated, given zofran and phenergan, with improvement in symptoms. Likely viral gastroenteritis. NST reactive.  Plan: - d/c home with dehydration return precations - start oral iron for anemia (h 8.7)  Katelyn Webb Katelyn Webb 5/30/201712:54 PM

## 2015-06-18 LAB — CULTURE, OB URINE

## 2015-06-19 DIAGNOSIS — R8271 Bacteriuria: Secondary | ICD-10-CM

## 2015-08-05 LAB — OB RESULTS CONSOLE GBS: GBS: POSITIVE

## 2015-08-10 ENCOUNTER — Inpatient Hospital Stay (HOSPITAL_COMMUNITY)
Admission: AD | Admit: 2015-08-10 | Discharge: 2015-08-10 | Disposition: A | Discharge status: Home / Self Care | Payer: Medicaid Other | Source: Ambulatory Visit | Attending: Family Medicine | Admitting: Family Medicine

## 2015-08-10 ENCOUNTER — Encounter (HOSPITAL_COMMUNITY): Payer: Self-pay | Admitting: *Deleted

## 2015-08-10 DIAGNOSIS — Z3483 Encounter for supervision of other normal pregnancy, third trimester: Secondary | ICD-10-CM | POA: Insufficient documentation

## 2015-08-10 DIAGNOSIS — Z3A38 38 weeks gestation of pregnancy: Secondary | ICD-10-CM | POA: Insufficient documentation

## 2015-08-10 DIAGNOSIS — O471 False labor at or after 37 completed weeks of gestation: Secondary | ICD-10-CM

## 2015-08-10 NOTE — MAU Note (Signed)
Pt presents with complaint of contractions.  

## 2015-08-10 NOTE — Discharge Instructions (Signed)
Fetal Movement Counts °Patient Name: __________________________________________________ Patient Due Date: ____________________ °Performing a fetal movement count is highly recommended in high-risk pregnancies, but it is good for every pregnant woman to do. Your health care provider may ask you to start counting fetal movements at 28 weeks of the pregnancy. Fetal movements often increase: °· After eating a full meal. °· After physical activity. °· After eating or drinking something sweet or cold. °· At rest. °Pay attention to when you feel the baby is most active. This will help you notice a pattern of your baby's sleep and wake cycles and what factors contribute to an increase in fetal movement. It is important to perform a fetal movement count at the same time each day when your baby is normally most active.  °HOW TO COUNT FETAL MOVEMENTS °1. Find a quiet and comfortable area to sit or lie down on your left side. Lying on your left side provides the best blood and oxygen circulation to your baby. °2. Write down the day and time on a sheet of paper or in a journal. °3. Start counting kicks, flutters, swishes, rolls, or jabs in a 2-hour period. You should feel at least 10 movements within 2 hours. °4. If you do not feel 10 movements in 2 hours, wait 2-3 hours and count again. Look for a change in the pattern or not enough counts in 2 hours. °SEEK MEDICAL CARE IF: °· You feel less than 10 counts in 2 hours, tried twice. °· There is no movement in over an hour. °· The pattern is changing or taking longer each day to reach 10 counts in 2 hours. °· You feel the baby is not moving as he or she usually does. °Date: ____________ Movements: ____________ Start time: ____________ Finish time: ____________  °Date: ____________ Movements: ____________ Start time: ____________ Finish time: ____________ °Date: ____________ Movements: ____________ Start time: ____________ Finish time: ____________ °Date: ____________ Movements:  ____________ Start time: ____________ Finish time: ____________ °Date: ____________ Movements: ____________ Start time: ____________ Finish time: ____________ °Date: ____________ Movements: ____________ Start time: ____________ Finish time: ____________ °Date: ____________ Movements: ____________ Start time: ____________ Finish time: ____________ °Date: ____________ Movements: ____________ Start time: ____________ Finish time: ____________  °Date: ____________ Movements: ____________ Start time: ____________ Finish time: ____________ °Date: ____________ Movements: ____________ Start time: ____________ Finish time: ____________ °Date: ____________ Movements: ____________ Start time: ____________ Finish time: ____________ °Date: ____________ Movements: ____________ Start time: ____________ Finish time: ____________ °Date: ____________ Movements: ____________ Start time: ____________ Finish time: ____________ °Date: ____________ Movements: ____________ Start time: ____________ Finish time: ____________ °Date: ____________ Movements: ____________ Start time: ____________ Finish time: ____________  °Date: ____________ Movements: ____________ Start time: ____________ Finish time: ____________ °Date: ____________ Movements: ____________ Start time: ____________ Finish time: ____________ °Date: ____________ Movements: ____________ Start time: ____________ Finish time: ____________ °Date: ____________ Movements: ____________ Start time: ____________ Finish time: ____________ °Date: ____________ Movements: ____________ Start time: ____________ Finish time: ____________ °Date: ____________ Movements: ____________ Start time: ____________ Finish time: ____________ °Date: ____________ Movements: ____________ Start time: ____________ Finish time: ____________  °Date: ____________ Movements: ____________ Start time: ____________ Finish time: ____________ °Date: ____________ Movements: ____________ Start time: ____________ Finish  time: ____________ °Date: ____________ Movements: ____________ Start time: ____________ Finish time: ____________ °Date: ____________ Movements: ____________ Start time: ____________ Finish time: ____________ °Date: ____________ Movements: ____________ Start time: ____________ Finish time: ____________ °Date: ____________ Movements: ____________ Start time: ____________ Finish time: ____________ °Date: ____________ Movements: ____________ Start time: ____________ Finish time: ____________  °Date: ____________ Movements: ____________ Start time: ____________ Finish   time: ____________ °Date: ____________ Movements: ____________ Start time: ____________ Finish time: ____________ °Date: ____________ Movements: ____________ Start time: ____________ Finish time: ____________ °Date: ____________ Movements: ____________ Start time: ____________ Finish time: ____________ °Date: ____________ Movements: ____________ Start time: ____________ Finish time: ____________ °Date: ____________ Movements: ____________ Start time: ____________ Finish time: ____________ °Date: ____________ Movements: ____________ Start time: ____________ Finish time: ____________  °Date: ____________ Movements: ____________ Start time: ____________ Finish time: ____________ °Date: ____________ Movements: ____________ Start time: ____________ Finish time: ____________ °Date: ____________ Movements: ____________ Start time: ____________ Finish time: ____________ °Date: ____________ Movements: ____________ Start time: ____________ Finish time: ____________ °Date: ____________ Movements: ____________ Start time: ____________ Finish time: ____________ °Date: ____________ Movements: ____________ Start time: ____________ Finish time: ____________ °Date: ____________ Movements: ____________ Start time: ____________ Finish time: ____________  °Date: ____________ Movements: ____________ Start time: ____________ Finish time: ____________ °Date: ____________  Movements: ____________ Start time: ____________ Finish time: ____________ °Date: ____________ Movements: ____________ Start time: ____________ Finish time: ____________ °Date: ____________ Movements: ____________ Start time: ____________ Finish time: ____________ °Date: ____________ Movements: ____________ Start time: ____________ Finish time: ____________ °Date: ____________ Movements: ____________ Start time: ____________ Finish time: ____________ °Date: ____________ Movements: ____________ Start time: ____________ Finish time: ____________  °Date: ____________ Movements: ____________ Start time: ____________ Finish time: ____________ °Date: ____________ Movements: ____________ Start time: ____________ Finish time: ____________ °Date: ____________ Movements: ____________ Start time: ____________ Finish time: ____________ °Date: ____________ Movements: ____________ Start time: ____________ Finish time: ____________ °Date: ____________ Movements: ____________ Start time: ____________ Finish time: ____________ °Date: ____________ Movements: ____________ Start time: ____________ Finish time: ____________ °  °This information is not intended to replace advice given to you by your health care provider. Make sure you discuss any questions you have with your health care provider. °  °Document Released: 02/02/2006 Document Revised: 01/24/2014 Document Reviewed: 10/31/2011 °Elsevier Interactive Patient Education ©2016 [employer] °Braxton Hicks Contractions °Contractions of the uterus can occur throughout pregnancy. Contractions are not always a sign that you are in labor.  °WHAT ARE BRAXTON HICKS CONTRACTIONS?  °Contractions that occur before labor are called Braxton Hicks contractions, or false labor. Toward the end of pregnancy (32-34 weeks), these contractions can develop more often and may become more forceful. This is not true labor because these contractions do not result in opening (dilatation) and thinning of  the cervix. They are sometimes difficult to tell apart from true labor because these contractions can be forceful and people have different pain tolerances. You should not feel embarrassed if you go to the hospital with false labor. Sometimes, the only way to tell if you are in true labor is for your health care provider to look for changes in the cervix. °If there are no prenatal problems or other health problems associated with the pregnancy, it is completely safe to be sent home with false labor and await the onset of true labor. °HOW CAN YOU TELL THE DIFFERENCE BETWEEN TRUE AND FALSE LABOR? °False Labor °· The contractions of false labor are usually shorter and not as hard as those of true labor.   °· The contractions are usually irregular.   °· The contractions are often felt in the front of the lower abdomen and in the groin.   °· The contractions may go away when you walk around or change positions while lying down.   °· The contractions get weaker and are shorter lasting as time goes on.   °· The contractions do not usually become progressively stronger, regular, and closer together as with true labor.   °True Labor °· Contractions in true   labor last 30-70 seconds, become very regular, usually become more intense, and increase in frequency.   °· The contractions do not go away with walking.   °· The discomfort is usually felt in the top of the uterus and spreads to the lower abdomen and low back.   °· True labor can be determined by your health care provider with an exam. This will show that the cervix is dilating and getting thinner.   °WHAT TO REMEMBER °· Keep up with your usual exercises and follow other instructions given by your health care provider.   °· Take medicines as directed by your health care provider.   °· Keep your regular prenatal appointments.   °· Eat and drink lightly if you think you are going into labor.   °· If Braxton Hicks contractions are making you uncomfortable:   °¨ Change your  position from lying down or resting to walking, or from walking to resting.   °¨ Sit and rest in a tub of warm water.   °¨ Drink 2-3 glasses of water. Dehydration may cause these contractions.   °¨ Do slow and deep breathing several times an hour.   °WHEN SHOULD I SEEK IMMEDIATE MEDICAL CARE? °Seek immediate medical care if: °· Your contractions become stronger, more regular, and closer together.   °· You have fluid leaking or gushing from your vagina.   °· You have a fever.   °· You pass blood-tinged mucus.   °· You have vaginal bleeding.   °· You have continuous abdominal pain.   °· You have low back pain that you never had before.   °· You feel your baby's head pushing down and causing pelvic pressure.   °· Your baby is not moving as much as it used to.   °  °This information is not intended to replace advice given to you by your health care provider. Make sure you discuss any questions you have with your health care provider. °  °Document Released: 01/03/2005 Document Revised: 01/08/2013 Document Reviewed: 10/15/2012 °Elsevier Interactive Patient Education ©2016 [employer] ° °

## 2015-08-15 ENCOUNTER — Inpatient Hospital Stay (HOSPITAL_COMMUNITY)
Admission: AD | Admit: 2015-08-15 | Discharge: 2015-08-15 | DRG: 781 | Disposition: A | Discharge status: Home / Self Care | Payer: Medicaid Other | Source: Ambulatory Visit | Attending: Family Medicine | Admitting: Family Medicine

## 2015-08-15 ENCOUNTER — Encounter (HOSPITAL_COMMUNITY): Payer: Self-pay

## 2015-08-15 DIAGNOSIS — O99820 Streptococcus B carrier state complicating pregnancy: Secondary | ICD-10-CM | POA: Diagnosis present

## 2015-08-15 DIAGNOSIS — A6000 Herpesviral infection of urogenital system, unspecified: Secondary | ICD-10-CM | POA: Diagnosis present

## 2015-08-15 DIAGNOSIS — B181 Chronic viral hepatitis B without delta-agent: Secondary | ICD-10-CM | POA: Diagnosis present

## 2015-08-15 DIAGNOSIS — Z3A39 39 weeks gestation of pregnancy: Secondary | ICD-10-CM | POA: Diagnosis not present

## 2015-08-15 DIAGNOSIS — IMO0001 Reserved for inherently not codable concepts without codable children: Secondary | ICD-10-CM

## 2015-08-15 DIAGNOSIS — O98413 Viral hepatitis complicating pregnancy, third trimester: Secondary | ICD-10-CM | POA: Diagnosis present

## 2015-08-15 DIAGNOSIS — O98313 Other infections with a predominantly sexual mode of transmission complicating pregnancy, third trimester: Secondary | ICD-10-CM | POA: Diagnosis present

## 2015-08-15 DIAGNOSIS — Z3483 Encounter for supervision of other normal pregnancy, third trimester: Secondary | ICD-10-CM | POA: Diagnosis present

## 2015-08-15 LAB — CBC
HCT: 33.5 % — ABNORMAL LOW (ref 36.0–46.0)
Hemoglobin: 10.4 g/dL — ABNORMAL LOW (ref 12.0–15.0)
MCH: 24.4 pg — AB (ref 26.0–34.0)
MCHC: 31 g/dL (ref 30.0–36.0)
MCV: 78.6 fL (ref 78.0–100.0)
PLATELETS: 108 10*3/uL — AB (ref 150–400)
RBC: 4.26 MIL/uL (ref 3.87–5.11)
RDW: 23.5 % — AB (ref 11.5–15.5)
WBC: 6 10*3/uL (ref 4.0–10.5)

## 2015-08-15 LAB — ABO/RH: ABO/RH(D): O POS

## 2015-08-15 LAB — TYPE AND SCREEN
ABO/RH(D): O POS
Antibody Screen: NEGATIVE

## 2015-08-15 MED ORDER — ONDANSETRON HCL 4 MG/2ML IJ SOLN: 4.0000 mg | Freq: Four times a day (QID) | INTRAMUSCULAR | Status: DC | PRN

## 2015-08-15 MED ORDER — ACETAMINOPHEN 325 MG PO TABS: 650.0000 mg | ORAL_TABLET | ORAL | Status: DC | PRN

## 2015-08-15 MED ORDER — PENICILLIN G POTASSIUM 5000000 UNITS IJ SOLR
2.5000 10*6.[IU] | INTRAVENOUS | Status: DC
  Administered 2015-08-15: 2.5 10*6.[IU] via INTRAVENOUS
  Filled 2015-08-15 (×2): qty 2.5

## 2015-08-15 MED ORDER — OXYTOCIN BOLUS FROM INFUSION: 500.0000 mL | Freq: Once | INTRAVENOUS | Status: DC

## 2015-08-15 MED ORDER — LACTATED RINGERS IV SOLN
INTRAVENOUS | Status: DC
  Administered 2015-08-15 (×2): via INTRAVENOUS

## 2015-08-15 MED ORDER — FLEET ENEMA 7-19 GM/118ML RE ENEM: 1.0000 | ENEMA | RECTAL | Status: DC | PRN

## 2015-08-15 MED ORDER — PENICILLIN G POTASSIUM 5000000 UNITS IJ SOLR
5.0000 10*6.[IU] | Freq: Once | INTRAVENOUS | Status: AC
  Administered 2015-08-15: 5 10*6.[IU] via INTRAVENOUS
  Filled 2015-08-15: qty 5

## 2015-08-15 MED ORDER — LACTATED RINGERS IV SOLN: 500.0000 mL | INTRAVENOUS | Status: DC | PRN

## 2015-08-15 MED ORDER — OXYTOCIN 40 UNITS IN LACTATED RINGERS INFUSION - SIMPLE MED
1.0000 m[IU]/min | INTRAVENOUS | Status: DC
  Administered 2015-08-15: 2 m[IU]/min via INTRAVENOUS

## 2015-08-15 MED ORDER — OXYCODONE-ACETAMINOPHEN 5-325 MG PO TABS: 1.0000 | ORAL_TABLET | ORAL | Status: DC | PRN

## 2015-08-15 MED ORDER — OXYTOCIN 40 UNITS IN LACTATED RINGERS INFUSION - SIMPLE MED
2.5000 [IU]/h | INTRAVENOUS | Status: DC
  Filled 2015-08-15: qty 1000

## 2015-08-15 MED ORDER — LIDOCAINE HCL (PF) 1 % IJ SOLN: 30.0000 mL | INTRAMUSCULAR | Status: DC | PRN

## 2015-08-15 MED ORDER — OXYCODONE-ACETAMINOPHEN 5-325 MG PO TABS
2.0000 | ORAL_TABLET | ORAL | Status: DC | PRN
Start: 2015-08-15 — End: 2015-08-16

## 2015-08-15 MED ORDER — TERBUTALINE SULFATE 1 MG/ML IJ SOLN: 0.2500 mg | Freq: Once | INTRAMUSCULAR | Status: DC | PRN

## 2015-08-15 MED ORDER — SOD CITRATE-CITRIC ACID 500-334 MG/5ML PO SOLN: 30.0000 mL | ORAL | Status: DC | PRN

## 2015-08-15 NOTE — Discharge Summary (Signed)
OB Discharge Summary     Patient Name: Katelyn Webb DOB: 05/01/1980 MRN: [id number]  Date of admission: 08/15/2015 Delivering MD: This patient has no babies on file. Patient did not deliver child, was sent home in latent labor, to return to hospital when in active labor.  Date of discharge: 08/15/2015  Admitting diagnosis: 40 WEEKS CONTACTIONS, VERY UNCOMFORTABLE Intrauterine pregnancy: [redacted]w[redacted]d     Secondary diagnosis:  Active Problems:   Indication for care or intervention in labor or delivery   Prolonged latent phase of labor  Additional problems: Chronic hepatitis B     Discharge diagnosis: Latent labor, prolonged                                                                                                Post partum procedures: N/A  Augmentation: Pitocin, but was stopped when patient was not making any progress and patient requested to go home and return when she was in active labor.  Complications: None  Hospital course:  Found to be in latent labor with no cervical change after pitocin was added and a good contraction pattern was established for a few hours. Patient states contractions are not very strong and would prefer to continue laboring at home and return when contractions are stronger, water breaks, or feels more uncomfortable or more pressure. She states she lives only 10 minutes away. FOB is with her in the room and will be with her at home for support.  Physical exam  Vitals:   08/15/15 1928 08/15/15 1931 08/15/15 2030 08/15/15 2031  BP:  105/64 (!) 89/56 (!) 89/56  Pulse:  83 73 73  Resp: 16   16  Temp:    98.8 F (37.1 C)  TempSrc:    Oral  SpO2:      Weight:      Height:       Constitutional: She is oriented to person, place, and time. She appears well-developed and well-nourished.  HEENT: Non-icteric; EOMI; Normocephalic.  Cardiovascular: Normal rate, regular rhythm and normal heart sounds, no murmurs, rubs, gallops.  Pulmonary/Chest: Effort  normal and breath sounds normal. CTAB. Abdominal: Soft. Non-tender. Gravid. Neurological: She is alert and oriented to person, place, and time. She has normal reflexes.  Skin: Skin is warm and dry. No ankle edema observed. Psychiatric: She has a normal mood and affect. Her behavior is normal. Judgment and thought content normal.  Labs: Lab Results  Component Value Date   WBC 6.0 08/15/2015   HGB 10.4 (L) 08/15/2015   HCT 33.5 (L) 08/15/2015   MCV 78.6 08/15/2015   PLT 108 (L) 08/15/2015   CMP Latest Ref Rng & Units 06/16/2015  Glucose 65 - 99 mg/dL 86  BUN 6 - 20 mg/dL 5(L)  Creatinine 4.09 - 1.00 mg/dL 8.11  Sodium 914 - 782 mmol/L 136  Potassium 3.5 - 5.1 mmol/L 3.5  Chloride 101 - 111 mmol/L 108  CO2 22 - 32 mmol/L 22  Calcium 8.9 - 10.3 mg/dL 9.5(A)  Total Protein 6.5 - 8.1 g/dL 6.0(L)  Total Bilirubin 0.3 - 1.2 mg/dL 0.3  Alkaline Phos  38 - 126 U/L 79  AST 15 - 41 U/L 16  ALT 14 - 54 U/L 9(L)    Discharge instruction: Labor precautions.  After visit meds:    Medication List    TAKE these medications   doxylamine (Sleep) 25 MG tablet Commonly known as:  UNISOM Take 25 mg by mouth at bedtime as needed for sleep.   ferrous sulfate 325 (65 FE) MG tablet Take 325 mg by mouth 3 (three) times daily with meals.   PNV PRENATAL PLUS MULTIVITAMIN 27-1 MG Tabs Take 1 tablet by mouth daily.       Diet: routine diet  Activity: As tolerated.  Outpatient follow up:2-4 days Follow up Appt:No future appointments. Follow up Visit:No Follow-up on file.  Postpartum contraception: Undecided     08/15/2015 Jen Mow, DO

## 2015-08-15 NOTE — H&P (Signed)
LABOR AND DELIVERY ADMISSION HISTORY AND PHYSICAL NOTE  Katelyn Webb is a 35 y.o. female [phone] with IUP at [redacted]w[redacted]d by LMP c/w 13 wk [place] presenting for active labor.   She reports positive fetal movement. She denies leakage of fluid or vaginal bleeding.  Prenatal History/Complications:  Chronic Hep B; HSV+2; GBS+; Low-lying placenta resolved.  Past Medical History: Past Medical History:  Diagnosis Date  . Hepatitis B   . Low iron     Past Surgical History: History reviewed. No pertinent surgical history.  Obstetrical History: OB History    Gravida Para Term Preterm AB Living   SAB TAB Ectopic Multiple Live Births                  Social History: Social History   Social History  . Marital status: Married    Spouse name: N/A  . Number of children: N/A  . Years of education: N/A   Social History Main Topics  . Smoking status: Never Smoker  . Smokeless tobacco: Never Used  . Alcohol use No  . Drug use: No  . Sexual activity: Yes   Other Topics Concern  . None   Social History Narrative  . None    Family History: Family History  Problem Relation Age of Onset  . Anesthesia problems Neg Hx   . Hypotension Neg Hx   . Malignant hyperthermia Neg Hx   . Pseudochol deficiency Neg Hx     Allergies: No Known Allergies  Prescriptions Prior to Admission  Medication Sig Dispense Refill Last Dose  . doxylamine, Sleep, (UNISOM) 25 MG tablet Take 25 mg by mouth at bedtime as needed for sleep.   08/14/2015 at Unknown time  . ferrous sulfate 325 (65 FE) MG tablet Take 325 mg by mouth 3 (three) times daily with meals.   08/15/2015 at Unknown time  . Prenatal Vit-Fe Fumarate-FA (PNV PRENATAL PLUS MULTIVITAMIN) 27-1 MG TABS Take 1 tablet by mouth daily.  0 08/15/2015 at Unknown time  . promethazine (PHENERGAN) 25 MG tablet Take 1 tablet (25 mg total) by mouth every 6 (six) hours as needed for nausea or vomiting. (Patient not taking: Reported on 08/15/2015) 30  tablet 2      Review of Systems   All systems reviewed and negative except as stated in HPI  Blood pressure 117/78, pulse 78, temperature 98.2 F (36.8 C), temperature source Oral, resp. rate 18, last menstrual period 11/15/2014, SpO2 100 %. General appearance: alert, cooperative and appears stated age Lungs: clear to auscultation bilaterally Heart: regular rate and rhythm Abdomen: soft, non-tender; bowel sounds normal Extremities: No calf swelling or tenderness Presentation: cephalic Fetal monitoring: 130, mod var, +accels, no decels Uterine activity: q4-10 min, irregular Dilation: 4 Effacement (%): 60 Station: -3, -2 Exam by:: Ginnie Smart RN   Prenatal labs: ABO, Rh:   O + Antibody:   NEG Rubella: !Error! IMMUNE RPR:   NEG HBsAg:   POSITIVE HIV:    NEG GBS: Positive (07/19 0000)  1 hr Glucola: 110 Genetic screening:  Neg Anatomy US: Normal, low-lying placenta resolved  Prenatal Transfer Tool  Maternal Diabetes: No Genetic Screening: Normal Maternal Ultrasounds/Referrals: Normal Fetal Ultrasounds or other Referrals:  None Maternal Substance Abuse:  No Significant Maternal Medications:  None Significant Maternal Lab Results: Lab values include: Group B Strep positive, HBsAG positive, HSV+2  No results found for this or any previous visit (from the past 24 hour(s)).  Patient Active Problem List   Diagnosis Date Noted  . GBS bacteriuria 06/19/2015  . Advanced maternal age in multigravida 02/19/2015  . Hep B w/o coma 07/26/2010    Assessment: Katelyn Webb is a 35 y.o. G4P3003 at [redacted]w[redacted]d here for active labor.  #Labor: Expectant management, pitocin as needed #Pain: Epidural as needed #FWB:  Category 1  #ID: GBS+, will start PCN;  Hep B+;  HSV+2 #MOF: Bottle #MOC: undecided #Circ: undecided  Jen Mow, DO 08/15/2015, 1:56 PM

## 2015-08-15 NOTE — Discharge Instructions (Signed)
Third Trimester of Pregnancy The third trimester is from week 29 through week 42, months 7 through 9. This trimester is when your unborn baby (fetus) is growing very fast. At the end of the ninth month, the unborn baby is about 20 inches in length. It weighs about 6-10 pounds.  HOME CARE  Avoid all smoking, herbs, and alcohol. Avoid drugs not approved by your doctor. Do not use any tobacco products, including cigarettes, chewing tobacco, and electronic cigarettes. If you need help quitting, ask your doctor. You may get counseling or other support to help you quit. Only take medicine as told by your doctor. Some medicines are safe and some are not during pregnancy. Exercise only as told by your doctor. Stop exercising if you start having cramps. Eat regular, healthy meals. Wear a good support bra if your breasts are tender. Do not use hot tubs, steam rooms, or saunas. Wear your seat belt when driving. Avoid raw meat, uncooked cheese, and liter boxes and soil used by cats. Take your prenatal vitamins. Take 1500-2000 milligrams of calcium daily starting at the 20th week of pregnancy until you deliver your baby. Try taking medicine that helps you poop (stool softener) as needed, and if your doctor approves. Eat more fiber by eating fresh fruit, vegetables, and whole grains. Drink enough fluids to keep your pee (urine) clear or pale yellow. Take warm water baths (sitz baths) to soothe pain or discomfort caused by hemorrhoids. Use hemorrhoid cream if your doctor approves. If you have puffy, bulging veins (varicose veins), wear support hose. Raise (elevate) your feet for 15 minutes, 3-4 times a day. Limit salt in your diet. Avoid heavy lifting, wear low heels, and sit up straight. Rest with your legs raised if you have leg cramps or low back pain. Visit your dentist if you have not gone during your pregnancy. Use a soft toothbrush to brush your teeth. Be gentle when you floss. You can have sex  (intercourse) unless your doctor tells you not to. Do not travel far distances unless you must. Only do so with your doctor's approval. Take prenatal classes. Practice driving to the hospital. Pack your hospital bag. Prepare the baby's room. Go to your doctor visits. GET HELP IF: You are not sure if you are in labor or if your water has broken. You are dizzy. You have mild cramps or pressure in your lower belly (abdominal). You have a nagging pain in your belly area. You continue to feel sick to your stomach (nauseous), throw up (vomit), or have watery poop (diarrhea). You have bad smelling fluid coming from your vagina. You have pain with peeing (urination). GET HELP RIGHT AWAY IF:  You have a fever. You are leaking fluid from your vagina. You are spotting or bleeding from your vagina. You have severe belly cramping or pain. You lose or gain weight rapidly. You have trouble catching your breath and have chest pain. You notice sudden or extreme puffiness (swelling) of your face, hands, ankles, feet, or legs. You have not felt the baby move in over an hour. You have severe headaches that do not go away with medicine. You have vision changes.   This information is not intended to replace advice given to you by your health care provider. Make sure you discuss any questions you have with your health care provider.   Document Released: 03/30/2009 Document Revised: 01/24/2014 Document Reviewed: 03/06/2012 Elsevier Interactive Patient Education 2016 [employer]. Vaginal Delivery During delivery, your health care provider will  help you give birth to your baby. During a vaginal delivery, you will work to push the baby out of your vagina. However, before you can push your baby out, a few things need to happen. The opening of your uterus (cervix) has to soften, thin out, and open up (dilate) all the way to 10 cm. Also, your baby has to move down from the uterus into your vagina.  SIGNS OF  LABOR  Your health care provider will first need to make sure you are in labor. Signs of labor include:   Passing what is called the mucous plug before labor begins. This is a small amount of blood-stained mucus.  Having regular, painful uterine contractions.   The time between contractions gets shorter.   The discomfort and pain gradually get more intense.  Contraction pains get worse when walking and do not go away when resting.   Your cervix becomes thinner (effacement) and dilates. BEFORE THE DELIVERY Once you are in labor and admitted into the hospital or care center, your health care provider may do the following:   Perform a complete physical exam.  Review any complications related to pregnancy or labor.  Check your blood pressure, pulse, temperature, and heart rate (vital signs).   Determine if, and when, the rupture of amniotic membranes occurred.  Do a vaginal exam (using a sterile glove and lubricant) to determine:   The position (presentation) of the baby. Is the baby's head presenting first (vertex) in the birth canal (vagina), or are the feet or buttocks first (breech)?   The level (station) of the baby's head within the birth canal.   The effacement and dilatation of the cervix.   An electronic fetal monitor is usually placed on your abdomen when you first arrive. This is used to monitor your contractions and the baby's heart rate.  When the monitor is on your abdomen (external fetal monitor), it can only pick up the frequency and length of your contractions. It cannot tell the strength of your contractions.  If it becomes necessary for your health care provider to know exactly how strong your contractions are or to see exactly what the baby's heart rate is doing, an internal monitor may be inserted into your vagina and uterus. Your health care provider will discuss the benefits and risks of using an internal monitor and obtain your permission before  inserting the device.  Continuous fetal monitoring may be needed if you have an epidural, are receiving certain medicines (such as oxytocin), or have pregnancy or labor complications.  An IV access tube may be placed into a vein in your arm to deliver fluids and medicines if necessary. THREE STAGES OF LABOR AND DELIVERY Normal labor and delivery is divided into three stages. First Stage This stage starts when you begin to contract regularly and your cervix begins to efface and dilate. It ends when your cervix is completely open (fully dilated). The first stage is the longest stage of labor and can last from 3 hours to 15 hours.  Several methods are available to help with labor pain. You and your health care provider will decide which option is best for you. Options include:   Opioid medicines. These are strong pain medicines that you can get through your IV tube or as a shot into your muscle. These medicines lessen pain but do not make it go away completely.  Epidural. A medicine is given through a thin tube that is inserted in your back. The medicine numbs  the lower part of your body and prevents any pain in that area.  Paracervical pain medicine. This is an injection of an anesthetic on each side of your cervix.   You may request natural childbirth, which does not involve the use of pain medicines or an epidural during labor and delivery. Instead, you will use other things, such as breathing exercises, to help cope with the pain. Second Stage The second stage of labor begins when your cervix is fully dilated at 10 cm. It continues until you push your baby down through the birth canal and the baby is born. This stage can take only minutes or several hours.  The location of your baby's head as it moves through the birth canal is reported as a number called a station. If the baby's head has not started its descent, the station is described as being at minus 3 (-3). When your baby's head is at  the zero station, it is at the middle of the birth canal and is engaged in the pelvis. The station of your baby helps indicate the progress of the second stage of labor.  When your baby is born, your health care provider may hold the baby with his or her head lowered to prevent amniotic fluid, mucus, and blood from getting into the baby's lungs. The baby's mouth and nose may be suctioned with a small bulb syringe to remove any additional fluid.  Your health care provider may then place the baby on your stomach. It is important to keep the baby from getting cold. To do this, the health care provider will dry the baby off, place the baby directly on your skin (with no blankets between you and the baby), and cover the baby with warm, dry blankets.   The umbilical cord is cut. Third Stage During the third stage of labor, your health care provider will deliver the placenta (afterbirth) and make sure your bleeding is under control. The delivery of the placenta usually takes about 5 minutes but can take up to 30 minutes. After the placenta is delivered, a medicine may be given either by IV or injection to help contract the uterus and control bleeding. If you are planning to breastfeed, you can try to do so now. After you deliver the placenta, your uterus should contract and get very firm. If your uterus does not remain firm, your health care provider will massage it. This is important because the contraction of the uterus helps cut off bleeding at the site where the placenta was attached to your uterus. If your uterus does not contract properly and stay firm, you may continue to bleed heavily. If there is a lot of bleeding, medicines may be given to contract the uterus and stop the bleeding.    This information is not intended to replace advice given to you by your health care provider. Make sure you discuss any questions you have with your health care provider.   Document Released: 10/13/2007 Document  Revised: 01/24/2014 Document Reviewed: 08/31/2011 Elsevier Interactive Patient Education [employer]

## 2015-08-15 NOTE — Anesthesia Pain Management Evaluation Note (Signed)
  CRNA Pain Management Visit Note  Patient: Katelyn Webb, 35 y.o., female  "Hello I am a member of the anesthesia team at [facility]. We have an anesthesia team available at all times to provide care throughout the hospital, including epidural management and anesthesia for C-section. I don't know your plan for the delivery whether it a natural birth, water birth, IV sedation, nitrous supplementation, doula or epidural, but we want to meet your pain goals."   1.Was your pain managed to your expectations on prior hospitalizations?   Yes   2.What is your expectation for pain management during this hospitalization?     Labor support without medications  3.How can we help you reach that goal? Nursing intervention  Record the patient's initial score and the patient's pain goal.   Pain: 6  Pain Goal: 6 [facility] wants you to be able to say your pain was always managed very well.  Salome Arnt 08/15/2015

## 2015-08-16 LAB — RPR: RPR Ser Ql: NONREACTIVE

## 2015-08-21 ENCOUNTER — Inpatient Hospital Stay (HOSPITAL_COMMUNITY)
Admission: AD | Admit: 2015-08-21 | Discharge: 2015-08-22 | DRG: 774 | Disposition: A | Discharge status: Home / Self Care | Payer: Medicaid Other | Source: Ambulatory Visit | Attending: Family Medicine | Admitting: Family Medicine

## 2015-08-21 ENCOUNTER — Encounter (HOSPITAL_COMMUNITY): Payer: Self-pay

## 2015-08-21 DIAGNOSIS — O9832 Other infections with a predominantly sexual mode of transmission complicating childbirth: Secondary | ICD-10-CM | POA: Diagnosis present

## 2015-08-21 DIAGNOSIS — B181 Chronic viral hepatitis B without delta-agent: Secondary | ICD-10-CM | POA: Diagnosis present

## 2015-08-21 DIAGNOSIS — Z3A39 39 weeks gestation of pregnancy: Secondary | ICD-10-CM

## 2015-08-21 DIAGNOSIS — A6000 Herpesviral infection of urogenital system, unspecified: Secondary | ICD-10-CM | POA: Diagnosis present

## 2015-08-21 DIAGNOSIS — O99824 Streptococcus B carrier state complicating childbirth: Secondary | ICD-10-CM

## 2015-08-21 DIAGNOSIS — O4202 Full-term premature rupture of membranes, onset of labor within 24 hours of rupture: Secondary | ICD-10-CM | POA: Diagnosis present

## 2015-08-21 DIAGNOSIS — IMO0001 Reserved for inherently not codable concepts without codable children: Secondary | ICD-10-CM

## 2015-08-21 DIAGNOSIS — O9842 Viral hepatitis complicating childbirth: Secondary | ICD-10-CM | POA: Diagnosis present

## 2015-08-21 LAB — CBC
HEMATOCRIT: 34 % — AB (ref 36.0–46.0)
HEMOGLOBIN: 10.8 g/dL — AB (ref 12.0–15.0)
MCH: 25 pg — ABNORMAL LOW (ref 26.0–34.0)
MCHC: 31.8 g/dL (ref 30.0–36.0)
MCV: 78.7 fL (ref 78.0–100.0)
Platelets: 115 10*3/uL — ABNORMAL LOW (ref 150–400)
RBC: 4.32 MIL/uL (ref 3.87–5.11)
RDW: 24.6 % — ABNORMAL HIGH (ref 11.5–15.5)
WBC: 5.6 10*3/uL (ref 4.0–10.5)

## 2015-08-21 LAB — RPR: RPR Ser Ql: NONREACTIVE

## 2015-08-21 LAB — TYPE AND SCREEN
ABO/RH(D): O POS
ANTIBODY SCREEN: NEGATIVE

## 2015-08-21 MED ORDER — FERROUS SULFATE 325 (65 FE) MG PO TABS
325.0000 mg | ORAL_TABLET | Freq: Two times a day (BID) | ORAL | Status: DC
  Administered 2015-08-21 – 2015-08-22 (×3): 325 mg via ORAL
  Filled 2015-08-21 (×4): qty 1

## 2015-08-21 MED ORDER — LACTATED RINGERS IV SOLN: 500.0000 mL | INTRAVENOUS | Status: DC | PRN

## 2015-08-21 MED ORDER — SODIUM CHLORIDE 0.9 % IV SOLN
2.0000 g | Freq: Four times a day (QID) | INTRAVENOUS | Status: DC
  Filled 2015-08-21 (×3): qty 2000

## 2015-08-21 MED ORDER — OXYTOCIN 40 UNITS IN LACTATED RINGERS INFUSION - SIMPLE MED: 2.5000 [IU]/h | INTRAVENOUS | Status: DC

## 2015-08-21 MED ORDER — TETANUS-DIPHTH-ACELL PERTUSSIS 5-2.5-18.5 LF-MCG/0.5 IM SUSP: 0.5000 mL | Freq: Once | INTRAMUSCULAR | Status: DC

## 2015-08-21 MED ORDER — OXYCODONE-ACETAMINOPHEN 5-325 MG PO TABS
2.0000 | ORAL_TABLET | ORAL | Status: DC | PRN
  Administered 2015-08-21: 2 via ORAL
  Filled 2015-08-21: qty 2

## 2015-08-21 MED ORDER — OXYTOCIN 40 UNITS IN LACTATED RINGERS INFUSION - SIMPLE MED
INTRAVENOUS | Status: AC
  Administered 2015-08-21: 500 mL via INTRAVENOUS
  Filled 2015-08-21: qty 1000

## 2015-08-21 MED ORDER — DIPHENHYDRAMINE HCL 25 MG PO CAPS: 25.0000 mg | ORAL_CAPSULE | Freq: Four times a day (QID) | ORAL | Status: DC | PRN

## 2015-08-21 MED ORDER — MEASLES, MUMPS & RUBELLA VAC ~~LOC~~ INJ
0.5000 mL | INJECTION | Freq: Once | SUBCUTANEOUS | Status: DC
  Filled 2015-08-21: qty 0.5

## 2015-08-21 MED ORDER — METHYLERGONOVINE MALEATE 0.2 MG PO TABS: 0.2000 mg | ORAL_TABLET | ORAL | Status: DC | PRN

## 2015-08-21 MED ORDER — OXYCODONE-ACETAMINOPHEN 5-325 MG PO TABS: 1.0000 | ORAL_TABLET | ORAL | Status: DC | PRN

## 2015-08-21 MED ORDER — DOCUSATE SODIUM 100 MG PO CAPS
100.0000 mg | ORAL_CAPSULE | Freq: Two times a day (BID) | ORAL | Status: DC
  Administered 2015-08-21 – 2015-08-22 (×2): 100 mg via ORAL
  Filled 2015-08-21 (×2): qty 1

## 2015-08-21 MED ORDER — PENICILLIN G POTASSIUM 5000000 UNITS IJ SOLR
5.0000 10*6.[IU] | Freq: Once | INTRAVENOUS | Status: DC
  Filled 2015-08-21: qty 5

## 2015-08-21 MED ORDER — OXYTOCIN BOLUS FROM INFUSION
500.0000 mL | Freq: Once | INTRAVENOUS | Status: AC
  Administered 2015-08-21: 500 mL via INTRAVENOUS

## 2015-08-21 MED ORDER — FLEET ENEMA 7-19 GM/118ML RE ENEM: 1.0000 | ENEMA | RECTAL | Status: DC | PRN

## 2015-08-21 MED ORDER — IBUPROFEN 600 MG PO TABS
600.0000 mg | ORAL_TABLET | Freq: Four times a day (QID) | ORAL | Status: DC
  Administered 2015-08-21 – 2015-08-22 (×7): 600 mg via ORAL
  Filled 2015-08-21 (×7): qty 1

## 2015-08-21 MED ORDER — FENTANYL CITRATE (PF) 100 MCG/2ML IJ SOLN: 50.0000 ug | INTRAMUSCULAR | Status: DC | PRN

## 2015-08-21 MED ORDER — OXYCODONE HCL 5 MG PO TABS: 10.0000 mg | ORAL_TABLET | ORAL | Status: DC | PRN

## 2015-08-21 MED ORDER — BISACODYL 10 MG RE SUPP: 10.0000 mg | Freq: Every day | RECTAL | Status: DC | PRN

## 2015-08-21 MED ORDER — ACETAMINOPHEN 325 MG PO TABS: 650.0000 mg | ORAL_TABLET | ORAL | Status: DC | PRN

## 2015-08-21 MED ORDER — SIMETHICONE 80 MG PO CHEW: 80.0000 mg | CHEWABLE_TABLET | ORAL | Status: DC | PRN

## 2015-08-21 MED ORDER — OXYCODONE HCL 5 MG PO TABS
5.0000 mg | ORAL_TABLET | ORAL | Status: DC | PRN
  Administered 2015-08-21: 5 mg via ORAL
  Filled 2015-08-21: qty 1

## 2015-08-21 MED ORDER — ONDANSETRON HCL 4 MG PO TABS: 4.0000 mg | ORAL_TABLET | ORAL | Status: DC | PRN

## 2015-08-21 MED ORDER — BENZOCAINE-MENTHOL 20-0.5 % EX AERO
1.0000 "application " | INHALATION_SPRAY | CUTANEOUS | Status: DC | PRN
  Administered 2015-08-21: 1 via TOPICAL
  Filled 2015-08-21: qty 56

## 2015-08-21 MED ORDER — PENICILLIN G POTASSIUM 5000000 UNITS IJ SOLR
2.5000 10*6.[IU] | INTRAVENOUS | Status: DC
  Filled 2015-08-21 (×3): qty 2.5

## 2015-08-21 MED ORDER — LIDOCAINE HCL (PF) 1 % IJ SOLN
INTRAMUSCULAR | Status: AC
  Administered 2015-08-21: 30 mL via SUBCUTANEOUS
  Filled 2015-08-21: qty 30

## 2015-08-21 MED ORDER — METHYLERGONOVINE MALEATE 0.2 MG/ML IJ SOLN: 0.2000 mg | INTRAMUSCULAR | Status: DC | PRN

## 2015-08-21 MED ORDER — DIBUCAINE 1 % RE OINT: 1.0000 "application " | TOPICAL_OINTMENT | RECTAL | Status: DC | PRN

## 2015-08-21 MED ORDER — LACTATED RINGERS IV SOLN: INTRAVENOUS | Status: DC

## 2015-08-21 MED ORDER — ONDANSETRON HCL 4 MG/2ML IJ SOLN: 4.0000 mg | INTRAMUSCULAR | Status: DC | PRN

## 2015-08-21 MED ORDER — ONDANSETRON HCL 4 MG/2ML IJ SOLN: 4.0000 mg | Freq: Four times a day (QID) | INTRAMUSCULAR | Status: DC | PRN

## 2015-08-21 MED ORDER — ZOLPIDEM TARTRATE 5 MG PO TABS: 5.0000 mg | ORAL_TABLET | Freq: Every evening | ORAL | Status: DC | PRN

## 2015-08-21 MED ORDER — SOD CITRATE-CITRIC ACID 500-334 MG/5ML PO SOLN: 30.0000 mL | ORAL | Status: DC | PRN

## 2015-08-21 MED ORDER — LIDOCAINE HCL (PF) 1 % IJ SOLN
30.0000 mL | INTRAMUSCULAR | Status: DC | PRN
  Administered 2015-08-21: 30 mL via SUBCUTANEOUS

## 2015-08-21 MED ORDER — PRENATAL MULTIVITAMIN CH
1.0000 | ORAL_TABLET | Freq: Every day | ORAL | Status: DC
  Administered 2015-08-21 – 2015-08-22 (×2): 1 via ORAL
  Filled 2015-08-21 (×2): qty 1

## 2015-08-21 MED ORDER — COCONUT OIL OIL: 1.0000 "application " | TOPICAL_OIL | Status: DC | PRN

## 2015-08-21 MED ORDER — FLEET ENEMA 7-19 GM/118ML RE ENEM: 1.0000 | ENEMA | Freq: Every day | RECTAL | Status: DC | PRN

## 2015-08-21 MED ORDER — WITCH HAZEL-GLYCERIN EX PADS: 1.0000 "application " | MEDICATED_PAD | CUTANEOUS | Status: DC | PRN

## 2015-08-21 NOTE — Progress Notes (Signed)
Assisted pt up to bathroom, gait steady. Pt able to void without difficulty. Peri care taught. Pt verbalized understanding. Pt tolerated ambulation well.

## 2015-08-21 NOTE — H&P (Signed)
LABOR AND DELIVERY ADMISSION HISTORY AND PHYSICAL NOTE  Katelyn Webb is a 35 y.o. female [phone] with IUP at [redacted]w[redacted]d by LMP c/w 13 wk [place] presenting for SROM at 0100, now contracting.  Was admitted a few days ago in presumed active labor, was given pitocin but cx didn't change so she asked to go home.    She reports positive fetal movement. PNC @ [facility].  Prenatal History/Complications:  Chronic Hep B; HSV+2; GBS+; Low-lying placenta resolved.  Past Medical History:     Past Medical History:  Diagnosis Date  . Hepatitis B   . Low iron     Past Surgical History: History reviewed. No pertinent surgical history.  Obstetrical History:         OB History    Gravida Para Term Preterm AB Living   4 3 3     3    SAB TAB Ectopic Multiple Live Births                  Social History: Social History        Social History  . Marital status: Married    Spouse name: N/A  . Number of children: N/A  . Years of education: N/A       Social History Main Topics  . Smoking status: Never Smoker  . Smokeless tobacco: Never Used  . Alcohol use No  . Drug use: No  . Sexual activity: Yes       Other Topics Concern  . None      Social History Narrative  . None    Family History:      Family History  Problem Relation Age of Onset  . Anesthesia problems Neg Hx   . Hypotension Neg Hx   . Malignant hyperthermia Neg Hx   . Pseudochol deficiency Neg Hx     Allergies: No Known Allergies         Prescriptions Prior to Admission  Medication Sig Dispense Refill Last Dose  . doxylamine, Sleep, (UNISOM) 25 MG tablet Take 25 mg by mouth at bedtime as needed for sleep.   08/14/2015 at Unknown time  . ferrous sulfate 325 (65 FE) MG tablet Take 325 mg by mouth 3 (three) times daily with meals.   08/15/2015 at Unknown time  . Prenatal Vit-Fe Fumarate-FA (PNV PRENATAL PLUS MULTIVITAMIN) 27-1 MG TABS Take 1 tablet by mouth daily.  0 08/15/2015 at Unknown  time  . promethazine (PHENERGAN) 25 MG tablet Take 1 tablet (25 mg total) by mouth every 6 (six) hours as needed for nausea or vomiting. (Patient not taking: Reported on 08/15/2015) 30 tablet 2      Review of Systems   All systems reviewed and negative except as stated in HPI  Vitals:   08/21/15 0134  BP: 124/74  Pulse: 69  Resp: 18  Temp: 98.3 F (36.8 C)    General appearance: alert, cooperative and appears stated age Lungs: clear to auscultation bilaterally Heart: regular rate and rhythm Abdomen: soft, non-tender; bowel sounds normal Extremities: No calf swelling or tenderness Presentation: cephalic Fetal monitoring: 130, mod var, +accels, no decels Uterine activity: q4-10 min, irregular Dilation: 4-5 Effacement (%): 80 Station: --2, -1 Exam by:: F Cresenzo-Dishmon Prenatal labs: ABO, Rh:   O + Antibody:   NEG Rubella: !Error! IMMUNE RPR:   NEG HBsAg:   POSITIVE HIV:    NEG GBS: Positive (07/19 0000)  1 hr Glucola: 110 Genetic screening:  Neg Anatomy US: Normal, low-lying placenta resolved  Prenatal Transfer Tool  Maternal Diabetes: No Genetic Screening: Normal Maternal Ultrasounds/Referrals: Normal Fetal Ultrasounds or other Referrals:  None Maternal Substance Abuse:  No Significant Maternal Medications:  None Significant Maternal Lab Results: Lab values include: Group B Strep positive, HBsAG positive, HSV+2  No results found for this or any previous visit (from the past 24 hour(s)).      Patient Active Problem List   Diagnosis Date Noted  . GBS bacteriuria 06/19/2015  . Advanced maternal age in multigravida 02/19/2015  . Hep B w/o coma 07/26/2010    Assessment: Katelyn Webb is a 35 y.o. G4P3003 at [redacted]w[redacted]d here for [facility] labor.  #Labor: Expectant management, pitocin as needed #Pain:            Epidural as needed #FWB:            Category 1  #ID: GBS+, will start PCN;  Hep B+;  HSV+2 #MOF: Bottle #MOC: undecided #Circ:  undecided

## 2015-08-21 NOTE — MAU Note (Signed)
Patient presents with ROM at 0100 clear fluid and cts. Fetus active.

## 2015-08-22 MED ORDER — IBUPROFEN 600 MG PO TABS: 600.0000 mg | ORAL_TABLET | Freq: Four times a day (QID) | ORAL | 0 refills | Status: AC

## 2015-08-22 MED ORDER — ACETAMINOPHEN 325 MG PO TABS: 650.0000 mg | ORAL_TABLET | ORAL | 1 refills | Status: AC | PRN

## 2015-08-22 NOTE — Lactation Note (Signed)
This note was copied from a baby's chart. Lactation Consultation Note  Patient Name: Boy Maleya Falkner ASTMH'D Date: 08/22/2015 Reason for consult: Initial assessment Infant is 93 hours old & seen by [facility] for initial assessment. This is mom's forth baby & she has BF all of them. Baby was born at [redacted]w[redacted]d & weighed 7+12.2# @ birth. Mom reports that she plans to alternate breast & formula every other feeding until her milk comes in & then do more breastfeeding until she returns to work & then she'll go back to doing more formula. Discussed milk volume and encouraged mom to BF more often even now and to trust her body that she has milk; mom stated she knows she does but prefers to do both.  Mom encouraged to feed baby 8-12 times/24 hours and with feeding cues. Provided BF booklet, feeding log, & BF resources; mom made aware of O/P services, breastfeeding support groups, community resources, and our phone # for post-discharge questions.  Provided mom with a hand pump for occasional use. Mom has [facility] & plans to call on Monday to set up appointment.  Mom reports no questions at this time. Encouraged mom to call for [facility] if help is needed at a future BF.  Maternal Data    Feeding    LATCH Score/Interventions                      Lactation Tools Discussed/Used Pump Review: Setup, frequency, and cleaning   Consult Status Consult Status: Follow-up Date: 08/23/15 Follow-up type: In-patient    Oneal Grout 08/22/2015, 3:22 PM

## 2015-08-22 NOTE — Discharge Summary (Signed)
Obstetric Discharge Summary Reason for Admission: rupture of membranes (spontaneous) Prenatal Procedures: ultrasound Intrapartum Procedures: spontaneous vaginal delivery and GBS prophylaxis, PCN protocol Postpartum Procedures: none Complications-Operative and Postpartum: vaginal laceration, 1st degree perineal repair Hemoglobin  Date Value Ref Range Status  08/21/2015 10.8 (L) 12.0 - 15.0 g/dL Final   HCT  Date Value Ref Range Status  08/21/2015 34.0 (L) 36.0 - 46.0 % Final    Physical Exam:  General: alert, cooperative and no distress Lochia: appropriate Uterine Fundus: firm Incision: NA DVT Evaluation: No evidence of DVT seen on physical exam.  Discharge Diagnoses: Term Pregnancy-delivered  Discharge Information: Date: 08/22/2015 Activity: pelvic rest Diet: routine Medications: Acetaminophen & ibuprofen for pain, prenatal vitamin Condition: stable Instructions: refer to practice specific booklet Discharge to: home Follow-up Information    [facility]. Call in 6 week(s).   Contact information: [address] [place] [place] [address] [phone]           Newborn Data: Live born female  Birth Weight: 7 lb 12.2 oz (3521 g) APGAR: 9, 9  Home with mother.  Andres Ege, MD, PGY-1, MPH 08/22/2015, 9:04 AM   I was present for the exam and agree with above. Admitted for SROM, labor. Inadequate GBS prophylaxis. Pt desires D/C even if baby is not. May room in PRN.   [place], CNM 08/22/2015 11:13 AM

## 2015-08-23 ENCOUNTER — Ambulatory Visit: Payer: Self-pay

## 2015-08-23 NOTE — Lactation Note (Signed)
This note was copied from a baby's chart. Lactation Consultation Note: Experienced BF mom has baby latched to the breast when I went into room. Reports he has been feeding well. She has been giving some formula also. Reports breasts are feeling a little fuller this morning. Encouraged to always breast feed first to prevent engorgement. No questions at present. Reviewed our phone number to call with questions/concerns.   Patient Name: Katelyn Webb Dooner ZOXWR'UToday's Date: 08/23/2015 Reason for consult: Follow-up assessment   Maternal Data Formula Feeding for Exclusion: Yes Reason for exclusion: Mother's choice to formula and breast feed on admission Does the patient have breastfeeding experience prior to this delivery?: Yes  Feeding Feeding Type: Breast Fed Length of feed: 30 min  LATCH Score/Interventions Latch: Grasps breast easily, tongue down, lips flanged, rhythmical sucking.  Audible Swallowing: A few with stimulation  Type of Nipple: Everted at rest and after stimulation  Comfort (Breast/Nipple): Soft / non-tender     Hold (Positioning): No assistance needed to correctly position infant at breast.  LATCH Score: 9  Lactation Tools Discussed/Used     Consult Status Consult Status: Complete    Pamelia HoitWeeks, Taliana Mersereau D 08/23/2015, 9:23 AM

## 2017-01-22 ENCOUNTER — Other Ambulatory Visit: Payer: Self-pay

## 2017-01-22 ENCOUNTER — Emergency Department (HOSPITAL_COMMUNITY): Payer: Self-pay

## 2017-01-22 ENCOUNTER — Emergency Department (HOSPITAL_COMMUNITY)
Admission: EM | Admit: 2017-01-22 | Discharge: 2017-01-22 | Disposition: A | Discharge status: Home / Self Care | Payer: Self-pay | Attending: Emergency Medicine | Admitting: Emergency Medicine

## 2017-01-22 ENCOUNTER — Encounter (HOSPITAL_COMMUNITY): Payer: Self-pay | Admitting: Emergency Medicine

## 2017-01-22 DIAGNOSIS — M25552 Pain in left hip: Secondary | ICD-10-CM | POA: Insufficient documentation

## 2017-01-22 DIAGNOSIS — Z79899 Other long term (current) drug therapy: Secondary | ICD-10-CM | POA: Insufficient documentation

## 2017-01-22 DIAGNOSIS — Z791 Long term (current) use of non-steroidal anti-inflammatories (NSAID): Secondary | ICD-10-CM | POA: Insufficient documentation

## 2017-01-22 LAB — POC URINE PREG, ED: Preg Test, Ur: NEGATIVE

## 2017-01-22 NOTE — ED Provider Notes (Signed)
[facility] EMERGENCY DEPARTMENT Provider Note   CSN: [id number] Arrival date & time: 01/22/17  1909     History   Chief Complaint Chief Complaint  Patient presents with  . Hip Pain    HPI Arcola Burandt is a 37 y.o. female.  HPI   Caitlan Gonser is a 37 y.o. female, with a history of Hep B, presenting to the ED with bilateral hip pain for over the last year. Pain is lateral, worse on the left, aching, nonradiating. Endorses stiffness upon waking. Has intermittently taken tylenol and ibuprofen. Has not been formally evaluated for this issue. Denies numbness, weakness, abdominal pain, urinary complaints, falls/trauma, or any other complaints.       Past Medical History:  Diagnosis Date  . Hepatitis B   . Low iron     Patient Active Problem List   Diagnosis Date Noted  . Indication for care or intervention in labor or delivery 08/15/2015  . Prolonged latent phase of labor 08/15/2015  . GBS bacteriuria 06/19/2015  . Advanced maternal age in multigravida 02/19/2015  . Hep B w/o coma 07/26/2010    History reviewed. No pertinent surgical history.  OB History    Gravida Para Term Preterm AB Living   4 4 4     4    SAB TAB Ectopic Multiple Live Births         0 4       Home Medications    Prior to Admission medications   Medication Sig Start Date End Date Taking? Authorizing Provider  acetaminophen (TYLENOL) 325 MG tablet Take 2 tablets (650 mg total) by mouth every 4 (four) hours as needed (for pain scale < 4). 08/22/15   Brein, Charlyn Minerva, MD  doxylamine, Sleep, (UNISOM) 25 MG tablet Take 25 mg by mouth at bedtime as needed for sleep.    [provider]  ibuprofen (ADVIL,MOTRIN) 600 MG tablet Take 1 tablet (600 mg total) by mouth every 6 (six) hours. 08/22/15   Brein, Charlyn Minerva, MD  Prenatal Vit-Fe Fumarate-FA (PNV PRENATAL PLUS MULTIVITAMIN) 27-1 MG TABS Take 1 tablet by mouth daily.    [provider]    Family History Family  History  Problem Relation Age of Onset  . Anesthesia problems Neg Hx   . Hypotension Neg Hx   . Malignant hyperthermia Neg Hx   . Pseudochol deficiency Neg Hx     Social History Social History   Tobacco Use  . Smoking status: Never Smoker  . Smokeless tobacco: Never Used  Substance Use Topics  . Alcohol use: No  . Drug use: No     Allergies   Patient has no known allergies.   Review of Systems Review of Systems  Constitutional: Negative for chills and fever.  Gastrointestinal: Negative for abdominal pain.  Genitourinary: Negative for dysuria and frequency.  Musculoskeletal: Positive for arthralgias.  All other systems reviewed and are negative.    Physical Exam Updated Vital Signs BP 112/89 (BP Location: Right Arm)   Pulse 93   Temp 98 F (36.7 C) (Oral)   Resp 16   Ht 5\' 2"  (1.575 m)   Wt 78.9 kg (174 lb)   LMP 12/31/2016   SpO2 100%   BMI 31.83 kg/m   Physical Exam  Constitutional: She appears well-developed and well-nourished. No distress.  HENT:  Head: Normocephalic and atraumatic.  Eyes: Conjunctivae are normal.  Neck: Neck supple.  Cardiovascular: Normal rate, regular rhythm and intact distal pulses.  Pulmonary/Chest:  Effort normal.  Musculoskeletal: She exhibits tenderness. She exhibits no edema.  Minimal left-sided lateral hip tenderness without swelling, deformity, or instability.   Full passive and active range of motion in the bilateral hips without noted discomfort. Normal motor function intact in all extremities and spine. No midline spinal tenderness.   Neurological: She is alert.  No noted acute sensory deficits. Strength 5/5 with flexion and extension at the bilateral hips, knees, and ankles. No noted gait deficit. Coordination intact with heel to shin testing.  Skin: Skin is warm and dry. She is not diaphoretic. No pallor.  Psychiatric: She has a normal mood and affect. Her behavior is normal.  Nursing note and vitals  reviewed.    ED Treatments / Results  Labs (all labs ordered are listed, but only abnormal results are displayed) Labs Reviewed  POC URINE PREG, ED    EKG  EKG Interpretation None       Radiology Dg Hip Unilat W Or Wo Pelvis 2-3 Views Left  Result Date: 01/22/2017 CLINICAL DATA:  Punch rheumatic bilateral hip and low back pain. EXAM: DG HIP (WITH OR WITHOUT PELVIS) 2-3V LEFT COMPARISON:  None. FINDINGS: There is no evidence of hip fracture or dislocation. An intrauterine device is seen within the mid pelvis. The hip joints are maintained bilaterally. No flattening of the femoral heads. No diastases nor abnormal sclerosis of the sacroiliac joints nor pubic symphysis. Soft tissues are unremarkable. IMPRESSION: Unremarkable pelvic and left hip radiographs. Electronically Signed   By: Tollie Ethavid  Kwon M.D.   On: 01/22/2017 23:13    Procedures Procedures (including critical care time)  Medications Ordered in ED Medications - No data to display   Initial Impression / Assessment and Plan / ED Course  I have reviewed the triage vital signs and the nursing notes.  Pertinent labs & imaging results that were available during my care of the patient were reviewed by me and considered in my medical decision making (see chart for details).     Patient presents for initial evaluation of chronic hip pain.  It was explained to the patient that we could do minimal evaluation here in the ED and most of the management would need to be handled by PCP.  Resources given.  X-rays without acute abnormalities. The patient was given instructions for home care as well as return precautions. Patient voices understanding of these instructions, accepts the plan, and is comfortable with discharge.     Final Clinical Impressions(s) / ED Diagnoses   Final diagnoses:  Left hip pain    ED Discharge Orders    None       Concepcion LivingJoy, Shyra Emile C, PA-C 01/22/17 2323    Rolland PorterJames, Mark, MD 01/26/17 1511

## 2017-01-22 NOTE — ED Triage Notes (Signed)
Pt c/o non traumatic bilat hip and low back pain worse on L side x 2 years, worse after birth of 4th child over a year ago. Denies numbness/tingling, steady gait Pt would also like Hepatitis B checked.

## 2017-01-22 NOTE — ED Notes (Signed)
Pt. Ambulated to bathroom with steady gait.  

## 2017-01-22 NOTE — Discharge Instructions (Signed)
The x-rays showed no acute abnormalities. Antiinflammatory medications: Take 600 mg of ibuprofen every 6 hours or 440 mg (over the counter dose) to 500 mg (prescription dose) of naproxen every 12 hours for the next 3 days. After this time, these medications may be used as needed for pain. Take these medications with food to avoid upset stomach. Choose only one of these medications, do not take them together.  Tylenol: Should you continue to have additional pain while taking the ibuprofen or naproxen, you may add in tylenol as needed. Your daily total maximum amount of tylenol from all sources should be limited to 4000mg /day for persons without liver problems, or 2000mg /day for those with liver problems. Lidocaine patches: These are available via either prescription or over-the-counter. The over-the-counter option may be more economical one and are likely just as effective. There are multiple over-the-counter brands, such as Salonpas. Exercises: Be sure to perform the attached exercises starting with three times a week and working up to performing them daily. This is an essential part of preventing long term problems.   Follow up with a primary care provider for any future management of these complaints.

## 2018-12-19 IMAGING — DX DG HIP (WITH OR WITHOUT PELVIS) 2-3V*L*
3 series · 3 of 3 positions shown · non-contrast
Comparison: None.

CLINICAL DATA: Punch rheumatic bilateral hip and low back pain.

EXAM:
DG HIP (WITH OR WITHOUT PELVIS) 2-3V LEFT

[pelvis ap]
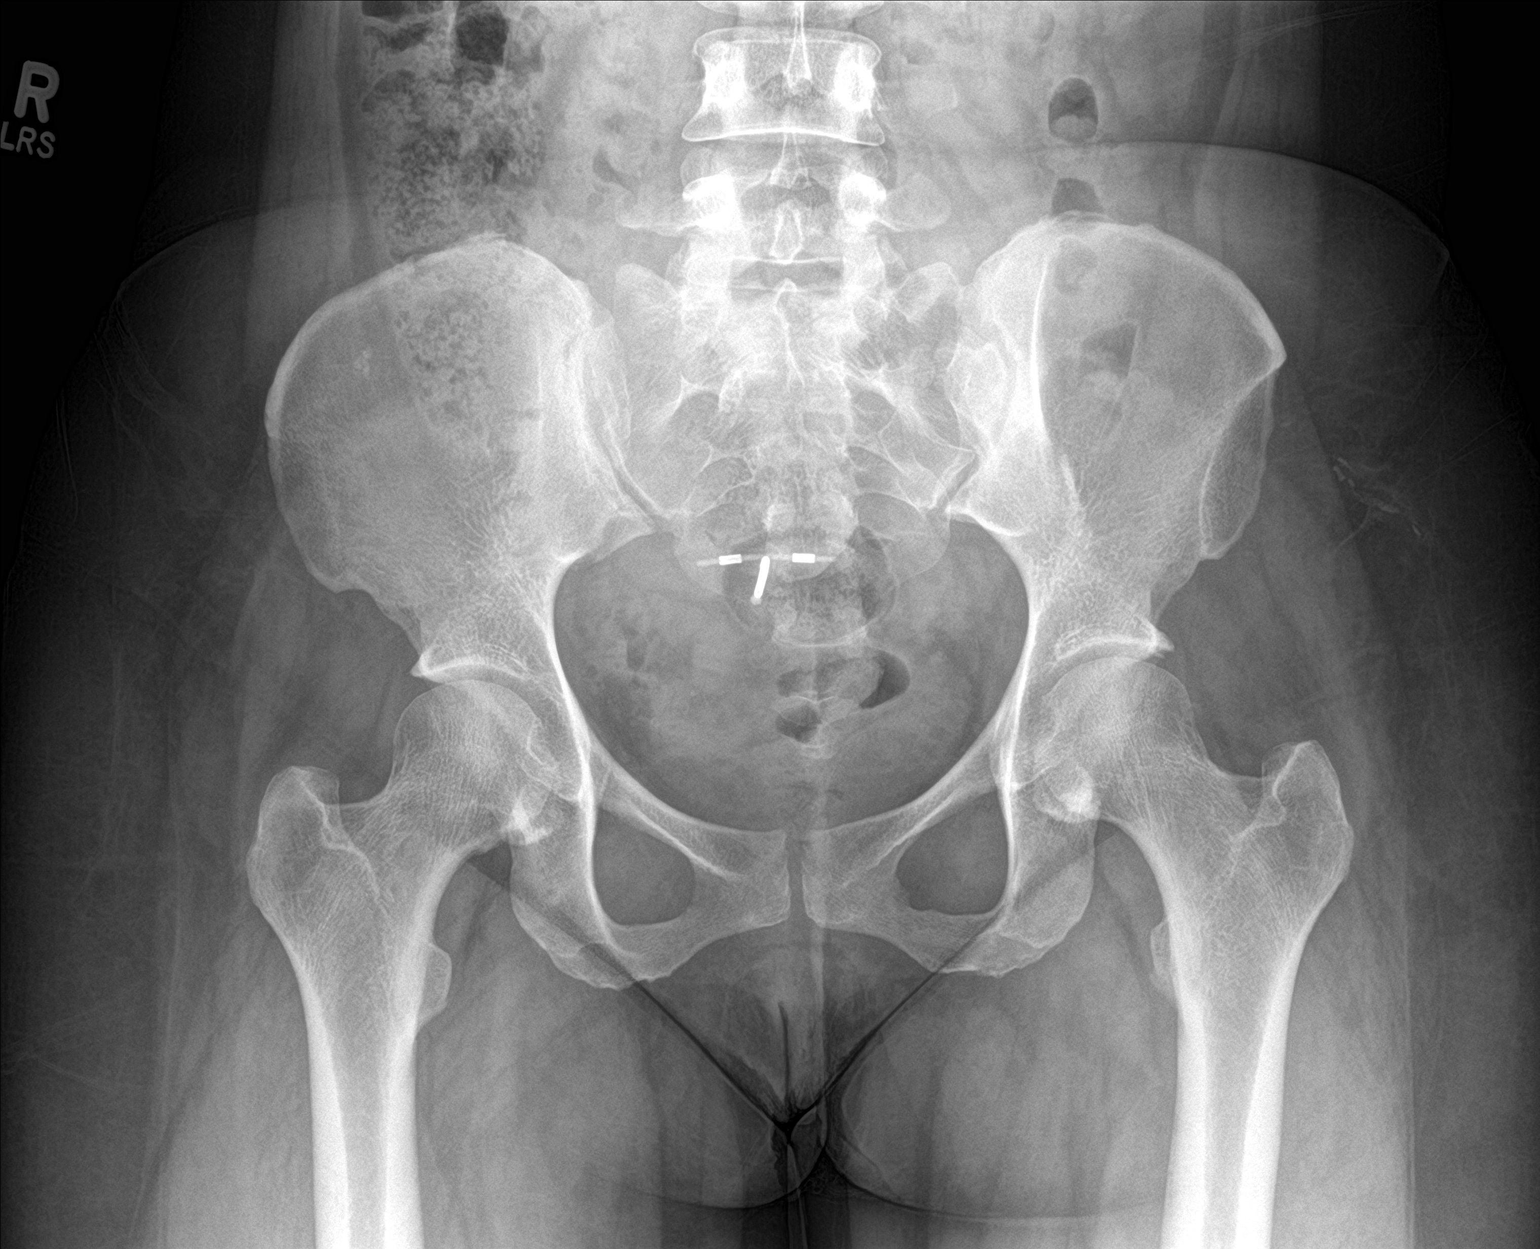

[hip ap]
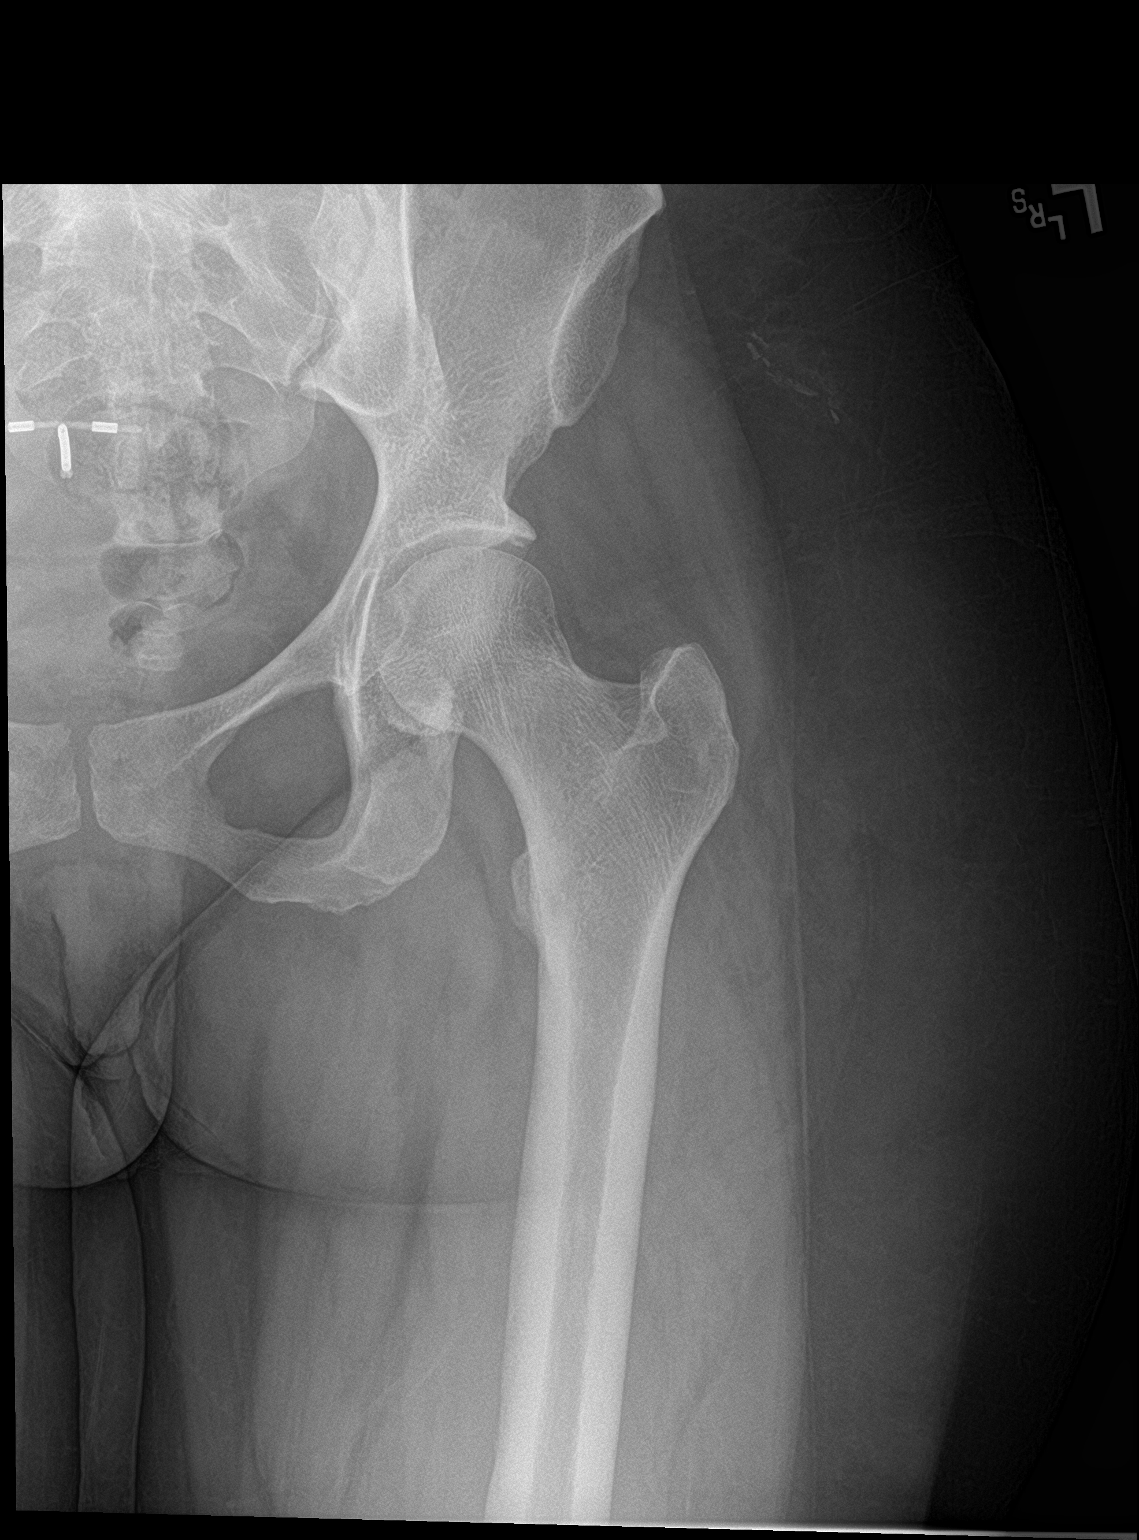

[hip lat]
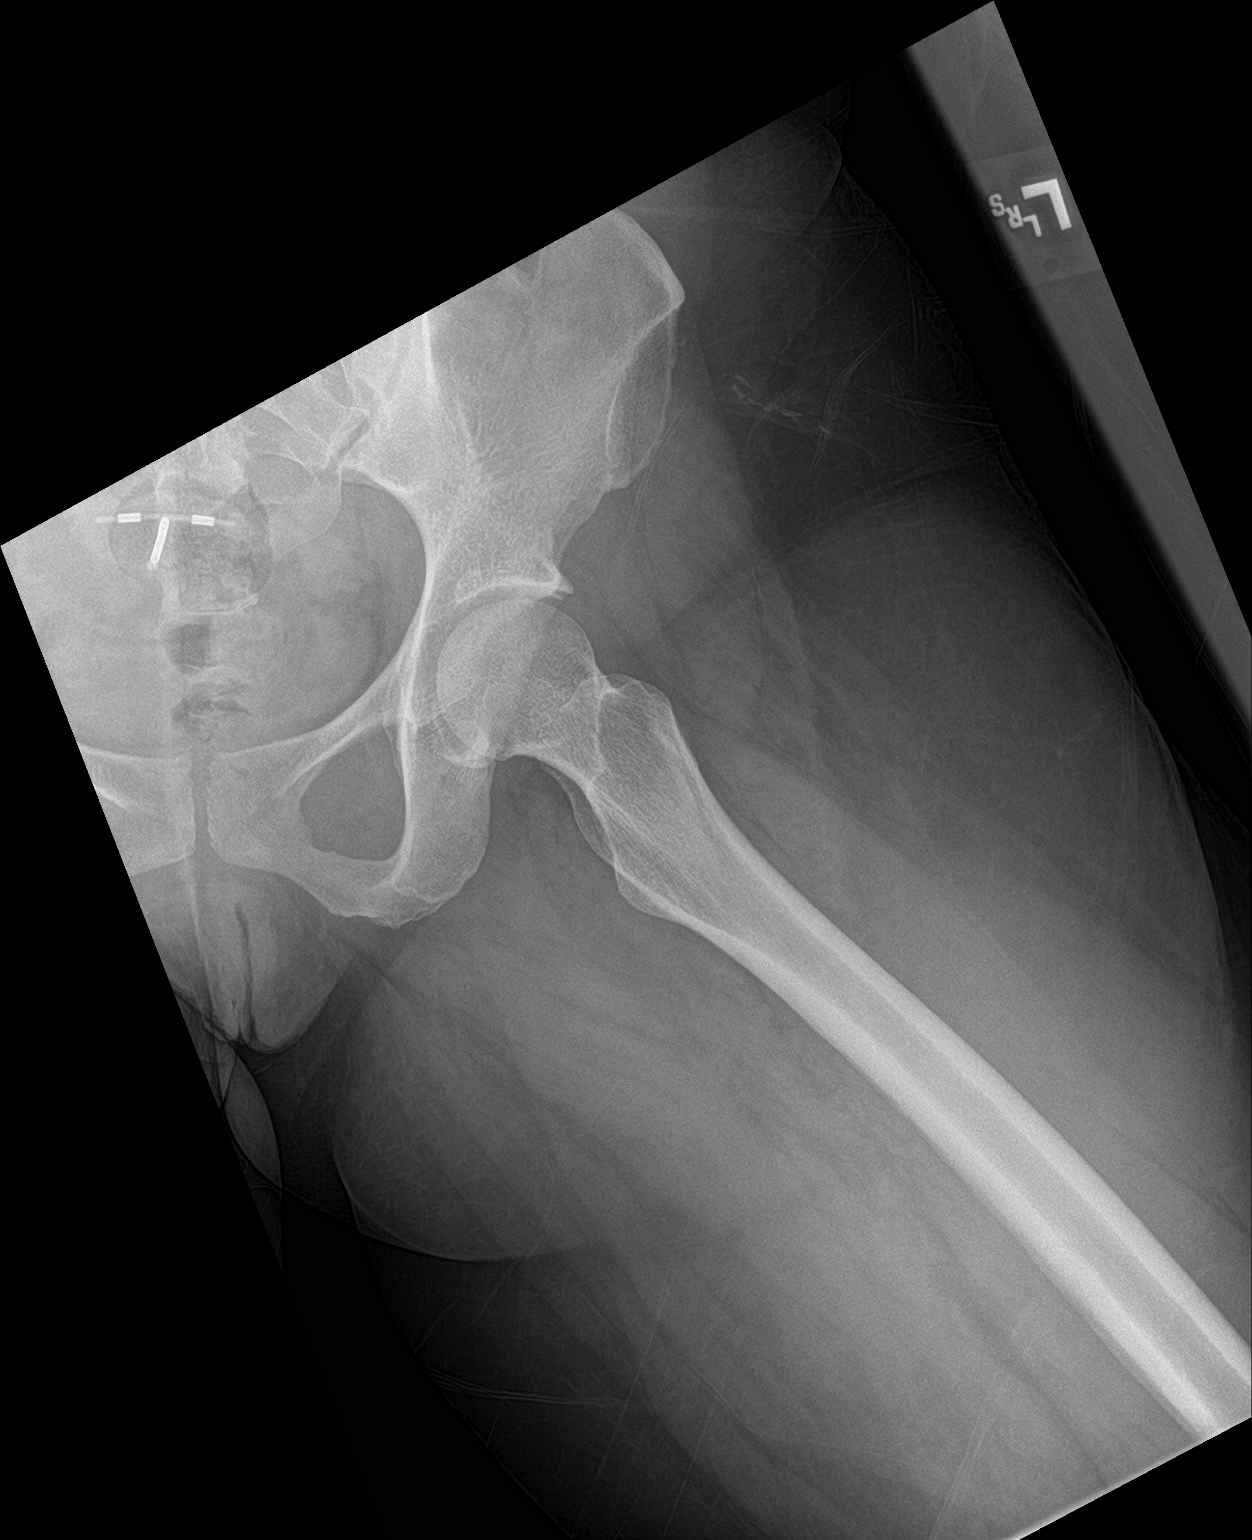

[3 of 3 positions shown; findings below may reference images not displayed]

FINDINGS: There is no evidence of hip fracture or dislocation. An intrauterine
device is seen within the mid pelvis. The hip joints are maintained
bilaterally. No flattening of the femoral heads. No diastases nor
abnormal sclerosis of the sacroiliac joints nor pubic symphysis.
Soft tissues are unremarkable.
IMPRESSION: Unremarkable pelvic and left hip radiographs.

## 2019-01-04 ENCOUNTER — Encounter (HOSPITAL_COMMUNITY): Payer: Self-pay | Admitting: Emergency Medicine

## 2019-01-04 ENCOUNTER — Other Ambulatory Visit: Payer: Self-pay

## 2019-01-04 ENCOUNTER — Emergency Department (HOSPITAL_COMMUNITY)
Admission: EM | Admit: 2019-01-04 | Discharge: 2019-01-04 | Disposition: A | Discharge status: Home / Self Care | Payer: Medicaid Other | Attending: Emergency Medicine | Admitting: Emergency Medicine

## 2019-01-04 DIAGNOSIS — U071 COVID-19: Secondary | ICD-10-CM | POA: Insufficient documentation

## 2019-01-04 DIAGNOSIS — Z791 Long term (current) use of non-steroidal anti-inflammatories (NSAID): Secondary | ICD-10-CM | POA: Insufficient documentation

## 2019-01-04 DIAGNOSIS — Z20822 Contact with and (suspected) exposure to covid-19: Secondary | ICD-10-CM

## 2019-01-04 LAB — SARS CORONAVIRUS 2 (TAT 6-24 HRS): SARS Coronavirus 2: POSITIVE — AB

## 2019-01-04 NOTE — ED Notes (Signed)
Pt discharge instructions reviewed with the patient. The patient verbalized understanding of instructions. Pt discharged. 

## 2019-01-04 NOTE — Discharge Instructions (Addendum)
We will contact you with the results of your Covid test when it is available. Return to the ED if you start to experience chest pain, shortness of breath, headache or blurry vision.

## 2019-01-04 NOTE — ED Provider Notes (Signed)
[facility] EMERGENCY DEPARTMENT Provider Note   CSN: [id number] Arrival date & time: 01/04/19  1313     History Chief Complaint  Patient presents with  . covid positive    needs PCR test per employer    Katelyn Webb is a 38 y.o. female who presents to ED requesting a Covid test.  States that for the past week she had a slight dry cough and sneezing, as well as influenza-like symptoms all of which were significantly improved with over-the-counter medications and ginger tea.  She went back to work yesterday but had a positive COVID-19 test done at work yesterday.  She was sent to the ED for a confirmation PCR test.  States that she feels "back to normal, I feel fine."  She reports numerous sick contacts with positive Covid test at work.  Denies any shortness of breath, chest pain, fever, vomiting.  HPI     Past Medical History:  Diagnosis Date  . Hepatitis B   . Low iron     Patient Active Problem List   Diagnosis Date Noted  . Indication for care or intervention in labor or delivery 08/15/2015  . Prolonged latent phase of labor 08/15/2015  . GBS bacteriuria 06/19/2015  . Advanced maternal age in multigravida 02/19/2015  . Hep B w/o coma 07/26/2010    History reviewed. No pertinent surgical history.   OB History    Gravida  4   Para  4   Term  4   Preterm      AB      Living  4     SAB      TAB      Ectopic      Multiple  0   Live Births  4           Family History  Problem Relation Age of Onset  . Anesthesia problems Neg Hx   . Hypotension Neg Hx   . Malignant hyperthermia Neg Hx   . Pseudochol deficiency Neg Hx     Social History   Tobacco Use  . Smoking status: Never Smoker  . Smokeless tobacco: Never Used  Substance Use Topics  . Alcohol use: No  . Drug use: No    Home Medications Prior to Admission medications   Medication Sig Start Date End Date Taking? Authorizing Provider  acetaminophen (TYLENOL) 325  MG tablet Take 2 tablets (650 mg total) by mouth every 4 (four) hours as needed (for pain scale < 4). 08/22/15   Brein, Charlyn Minerva, MD  doxylamine, Sleep, (UNISOM) 25 MG tablet Take 25 mg by mouth at bedtime as needed for sleep.    [provider]  ibuprofen (ADVIL,MOTRIN) 600 MG tablet Take 1 tablet (600 mg total) by mouth every 6 (six) hours. 08/22/15   Brein, Charlyn Minerva, MD  Prenatal Vit-Fe Fumarate-FA (PNV PRENATAL PLUS MULTIVITAMIN) 27-1 MG TABS Take 1 tablet by mouth daily.    [provider]    Allergies    Patient has no known allergies.  Review of Systems   Review of Systems  Constitutional: Negative for chills and fever.  Respiratory: Negative for cough and shortness of breath.   Cardiovascular: Negative for chest pain.  Gastrointestinal: Negative for vomiting.    Physical Exam Updated Vital Signs BP 132/88 (BP Location: Right Arm)   Pulse 81   Temp 98.4 F (36.9 C) (Oral)   Resp 16   SpO2 100%   Physical Exam Vitals and nursing  note reviewed.  Constitutional:      General: She is not in acute distress.    Appearance: She is well-developed. She is not diaphoretic.  HENT:     Head: Normocephalic and atraumatic.  Eyes:     General: No scleral icterus.    Conjunctiva/sclera: Conjunctivae normal.  Pulmonary:     Effort: Pulmonary effort is normal. No respiratory distress.  Musculoskeletal:     Cervical back: Normal range of motion.  Skin:    Findings: No rash.  Neurological:     Mental Status: She is alert.     ED Results / Procedures / Treatments   Labs (all labs ordered are listed, but only abnormal results are displayed) Labs Reviewed  SARS CORONAVIRUS 2 (TAT 6-24 HRS)    EKG None  Radiology No results found.  Procedures Procedures (including critical care time)  Medications Ordered in ED Medications - No data to display  ED Course  I have reviewed the triage vital signs and the nursing notes.  Pertinent labs & imaging results  that were available during my care of the patient were reviewed by me and considered in my medical decision making (see chart for details).    MDM Rules/Calculators/A&P                      Katelyn Webb was evaluated in Emergency Department on 01/04/19  for the symptoms described in the history of present illness. He/she was evaluated in the context of the global COVID-19 pandemic, which necessitated consideration that the patient might be at risk for infection with the SARS-CoV-2 virus that causes COVID-19. Institutional protocols and algorithms that pertain to the evaluation of patients at risk for COVID-19 are in a state of rapid change based on information released by regulatory bodies including the CDC and federal and state organizations. These policies and algorithms were followed during the patient's care in the ED.  38 year old female with a positive COVID-19 test at her job yesterday now asymptomatic presents to the ED requesting a PCR test for COVID-19.  Her cough, upper respiratory symptoms gradually improved with home remedies and over-the-counter medications.  Her vital signs are within normal limits.  I have obtained a Covid PCR swab and patient will be contacted with results.  Patient is hemodynamically stable, in NAD, and able to ambulate in the ED. Evaluation does not show pathology that would require ongoing emergent intervention or inpatient treatment. I explained the diagnosis to the patient. Pain has been managed and has no complaints prior to discharge. Patient is comfortable with above plan and is stable for discharge at this time. All questions were answered prior to disposition. Strict return precautions for returning to the ED were discussed. Encouraged follow up with PCP.   An After Visit Summary was printed and given to the patient.   Portions of this note were generated with [employer]. Dictation errors may occur despite best attempts at  proofreading.  Final Clinical Impression(s) / ED Diagnoses Final diagnoses:  Close exposure to COVID-19 virus    Rx / DC Orders ED Discharge Orders    None       Delia Heady, PA-C 01/04/19 1432    Sherwood Gambler, MD 01/04/19 1534

## 2019-01-04 NOTE — ED Triage Notes (Signed)
Pt was exposed to covid at her job, had test there that was positive yesterday. Per pts employer she has to come to the ER to have a PCR test to confirm. Pt states she is coughing and sneezing.

## 2021-11-12 ENCOUNTER — Emergency Department (HOSPITAL_COMMUNITY)
Admission: EM | Admit: 2021-11-12 | Discharge: 2021-11-13 | Disposition: A | Discharge status: Home / Self Care | Payer: Medicaid Other | Attending: Emergency Medicine | Admitting: Emergency Medicine

## 2021-11-12 DIAGNOSIS — W268XXA Contact with other sharp object(s), not elsewhere classified, initial encounter: Secondary | ICD-10-CM | POA: Insufficient documentation

## 2021-11-12 DIAGNOSIS — S61511A Laceration without foreign body of right wrist, initial encounter: Secondary | ICD-10-CM | POA: Insufficient documentation

## 2021-11-12 DIAGNOSIS — Z23 Encounter for immunization: Secondary | ICD-10-CM | POA: Insufficient documentation

## 2021-11-12 NOTE — ED Provider Triage Note (Signed)
Emergency Medicine Provider Triage Evaluation Note  Katelyn Webb , a 41 y.o. female  was evaluated in triage.  Pt complains of laceration to right wrist. Occurred when opening a can just PTA. Laceration to dorsum of right wrist. Persistent slow oozing. No pulsatile bleeding. No numbness or weakness.  Review of Systems  Positive: laceration Negative:   Physical Exam  BP (!) 127/94 (BP Location: Left Arm)   Pulse 84   Temp 98.1 F (36.7 C)   Resp 18   SpO2 100%  Gen:   Awake, no distress   Resp:  Normal effort  MSK:   Moves extremities without difficulty  Other:  1.5 cm laceration to dorsum of right wrist with slow oozing blood. No pulsatile bleeding  Medical Decision Making  Medically screening exam initiated at 11:57 PM.  Appropriate orders placed.  Katelyn Webb was informed that the remainder of the evaluation will be completed by another provider, this initial triage assessment does not replace that evaluation, and the importance of remaining in the ED until their evaluation is complete.  Laceration. Pressure dressing placed   [name] A, PA-C 11/12/21 2359

## 2021-11-13 ENCOUNTER — Other Ambulatory Visit: Payer: Self-pay

## 2021-11-13 ENCOUNTER — Encounter (HOSPITAL_COMMUNITY): Payer: Self-pay | Admitting: *Deleted

## 2021-11-13 MED ORDER — TETANUS-DIPHTH-ACELL PERTUSSIS 5-2.5-18.5 LF-MCG/0.5 IM SUSY
0.5000 mL | PREFILLED_SYRINGE | Freq: Once | INTRAMUSCULAR | Status: AC
  Administered 2021-11-13: 0.5 mL via INTRAMUSCULAR
  Filled 2021-11-13: qty 0.5

## 2021-11-13 MED ORDER — LIDOCAINE-EPINEPHRINE (PF) 2 %-1:200000 IJ SOLN
10.0000 mL | Freq: Once | INTRAMUSCULAR | Status: DC
  Filled 2021-11-13: qty 20

## 2021-11-13 NOTE — ED Triage Notes (Signed)
Laceration to the rt wrist with a metal can lid   approx 1-2 hours ago  bleeding controlled with pressure and bandage

## 2021-11-13 NOTE — Discharge Instructions (Addendum)
Please return to the ED with any new concerns or symptoms Please read attached guide concerning tissue-based of care at home Please leave the skin glue in place.  It will fall off after 7 to 10 days.

## 2021-11-13 NOTE — ED Provider Notes (Signed)
Katelyn Webb   CSN: [id number] Arrival date & time: 11/12/21  2309     History  Chief Complaint  Patient presents with   Laceration    Katelyn Webb is a 41 y.o. female with medical history of hepatitis B.  Patient presents to the ED for evaluation of right wrist laceration.  Patient reports that prior to arrival she cut her right wrist on a metal can lid.  The patient denies any lightheadedness, dizziness or loss of consciousness.  Patient unsure of last tetanus update.  Patient denies any numbness or tingling into her right hand.  Patient able to actively flex and extend the wrist.   Laceration      Home Medications Prior to Admission medications   Medication Sig Start Date End Date Taking? Authorizing Provider  acetaminophen (TYLENOL) 325 MG tablet Take 2 tablets (650 mg total) by mouth every 4 (four) hours as needed (for pain scale < 4). 08/22/15   Brein, Virl Diamond, MD  doxylamine, Sleep, (UNISOM) 25 MG tablet Take 25 mg by mouth at bedtime as needed for sleep.    [provider]  ibuprofen (ADVIL,MOTRIN) 600 MG tablet Take 1 tablet (600 mg total) by mouth every 6 (six) hours. 08/22/15   Brein, Virl Diamond, MD  Prenatal Vit-Fe Fumarate-FA (PNV PRENATAL PLUS MULTIVITAMIN) 27-1 MG TABS Take 1 tablet by mouth daily.    [provider]      Allergies    Patient has no known allergies.    Review of Systems   Review of Systems  Skin:  Positive for wound.  All other systems reviewed and are negative.   Physical Exam Updated Vital Signs BP 125/84 (BP Location: Left Arm)   Pulse 65   Temp 97.9 F (36.6 C)   Resp 18   Ht 5\' 2"  (1.575 m)   Wt 78.9 kg   LMP 11/08/2021   SpO2 100%   BMI 31.81 kg/m  Physical Exam Vitals and nursing Webb reviewed.  Constitutional:      General: She is not in acute distress.    Appearance: She is well-developed.  HENT:     Head: Normocephalic and atraumatic.  Eyes:      Conjunctiva/sclera: Conjunctivae normal.  Cardiovascular:     Rate and Rhythm: Normal rate and regular rhythm.     Heart sounds: No murmur heard. Pulmonary:     Effort: Pulmonary effort is normal. No respiratory distress.     Breath sounds: Normal breath sounds.  Abdominal:     Palpations: Abdomen is soft.     Tenderness: There is no abdominal tenderness.  Musculoskeletal:        General: No swelling.     Cervical back: Neck supple.  Skin:    General: Skin is warm and dry.     Capillary Refill: Capillary refill takes less than 2 seconds.     Comments: 1.5 cm laceration dorsum of right wrist.  No tendon involvement.  Patient able to flex and extend wrist actively.  No bleeding.  Neurological:     Mental Status: She is alert. Mental status is at baseline.  Psychiatric:        Mood and Affect: Mood normal.        ED Results / Procedures / Treatments   Labs (all labs ordered are listed, but only abnormal results are displayed) Labs Reviewed - No data to display  EKG None  Radiology No results found.  Procedures .Marland KitchenLaceration  Repair  Date/Time: 11/13/2021 9:48 AM  Performed by: Al Decant, PA-C Authorized by: Al Decant, PA-C   Consent:    Consent obtained:  Verbal   Consent given by:  Patient   Risks, benefits, and alternatives were discussed: yes     Risks discussed:  Infection, need for additional repair, nerve damage, poor wound healing, pain, poor cosmetic result, retained foreign body, tendon damage and vascular damage   Alternatives discussed:  No treatment Universal protocol:    Patient identity confirmed:  Verbally with patient and arm band Anesthesia:    Anesthesia method:  None Laceration details:    Location:  Shoulder/arm   Shoulder/arm location:  R lower arm   Length (cm):  1.5 Exploration:    Limited defect created (wound extended): no     Hemostasis achieved with:  Direct pressure   Imaging outcome: foreign body not noted    Treatment:    Irrigation solution:  Sterile saline Skin repair:    Repair method:  Tissue adhesive Approximation:    Approximation:  Close Post-procedure details:    Procedure completion:  Tolerated well, no immediate complications    Medications Ordered in ED Medications  lidocaine-EPINEPHrine (XYLOCAINE W/EPI) 2 %-1:200000 (PF) injection 10 mL (has no administration in time range)  Tdap (BOOSTRIX) injection 0.5 mL (0.5 mLs Intramuscular Given 11/13/21 1007)    ED Course/ Medical Decision Making/ A&P                           Medical Decision Making Risk Prescription drug management.   41 year old female presents to ED for evaluation of right wrist laceration.  Please see HPI for further details.  On my examination the patient has a 1.5 cm laceration to the dorsum of her right wrist.  There is no tendon involvement.  The patient can flex and extend her wrist actively without difficulty.  There is no bleeding actively.  This wound appears superficial.  The patient is unsure of her last tetanus update so we will update her tetanus here.  The patient will have her wound closed with Dermabond.  The patient will be advised on how to care for this at home.  The patient was given return precautions and she voiced understanding.  The patient and all questions answered to her satisfaction.  The patient is stable at this time for discharge home.  Final Clinical Impression(s) / ED Diagnoses Final diagnoses:  Laceration of right wrist, initial encounter    Rx / DC Orders ED Discharge Orders     None         Al Decant, PA-C 11/13/21 1013    Melene Plan, DO 11/13/21 1338

## 2023-09-18 ENCOUNTER — Emergency Department (HOSPITAL_COMMUNITY)
Admission: EM | Admit: 2023-09-18 | Discharge: 2023-09-18 | Disposition: A | Discharge status: Home / Self Care | Attending: Emergency Medicine | Admitting: Emergency Medicine

## 2023-09-18 ENCOUNTER — Encounter (HOSPITAL_COMMUNITY): Payer: Self-pay

## 2023-09-18 ENCOUNTER — Emergency Department (HOSPITAL_COMMUNITY)

## 2023-09-18 ENCOUNTER — Other Ambulatory Visit: Payer: Self-pay

## 2023-09-18 DIAGNOSIS — M25562 Pain in left knee: Secondary | ICD-10-CM | POA: Diagnosis not present

## 2023-09-18 DIAGNOSIS — M25561 Pain in right knee: Secondary | ICD-10-CM | POA: Diagnosis not present

## 2023-09-18 DIAGNOSIS — M222X1 Patellofemoral disorders, right knee: Secondary | ICD-10-CM | POA: Insufficient documentation

## 2023-09-18 DIAGNOSIS — M222X2 Patellofemoral disorders, left knee: Secondary | ICD-10-CM | POA: Diagnosis not present

## 2023-09-18 MED ORDER — LIDOCAINE 5 % EX PTCH
2.0000 | MEDICATED_PATCH | Freq: Once | CUTANEOUS | Status: DC
  Administered 2023-09-18: 2 via TRANSDERMAL
  Filled 2023-09-18: qty 2

## 2023-09-18 MED ORDER — NAPROXEN 500 MG PO TABS
500.0000 mg | ORAL_TABLET | Freq: Once | ORAL | Status: AC
  Administered 2023-09-18: 500 mg via ORAL
  Filled 2023-09-18: qty 1

## 2023-09-18 MED ORDER — LIDOCAINE 5 % EX PTCH: 1.0000 | MEDICATED_PATCH | CUTANEOUS | 0 refills | Status: AC

## 2023-09-18 NOTE — ED Triage Notes (Signed)
 Pt reports bilateral knee pain x 2 months, progressively worsening. Reports grinding noise when bending over. Similar pain reported in right hip. Denies any injury. All pain is chronic.

## 2023-09-18 NOTE — ED Provider Notes (Signed)
 [place] EMERGENCY DEPARTMENT AT [facility] Provider Note   CSN: [id number] Arrival date & time: 09/18/23  8086     Patient presents with: Knee Pain   Katelyn Webb is a 43 y.o. female with no stated past medical history presents with concern for bilateral knee pain ongoing for the past 2 months.  Denies any known injury.  She reports that she has pain on the upper knee and behind her kneecaps that occurs when she bends her knees to kneel and pray.  She denies any pain when walking or doing other activities.  Recently, she started noticing a little bit of pain in the right knee when walking, but pain is mostly still with bending and kneeling.  She denies any numbness in the legs.  Denies any swelling in her legs.    Knee Pain      Prior to Admission medications   Medication Sig Start Date End Date Taking? Authorizing Provider  lidocaine  (LIDODERM ) 5 % Place 1 patch onto the skin daily. Apply 1 patch for up to 12 hours at a time.  You must remove the patch for full 12 hours before reapplying a new patch. 09/18/23  Yes Veta Palma, PA-C  acetaminophen  (TYLENOL ) 325 MG tablet Take 2 tablets (650 mg total) by mouth every 4 (four) hours as needed (for pain scale < 4). 08/22/15   Brein, Lolita SAUNDERS, MD  doxylamine, Sleep, (UNISOM) 25 MG tablet Take 25 mg by mouth at bedtime as needed for sleep.    [provider]  ibuprofen  (ADVIL ,MOTRIN ) 600 MG tablet Take 1 tablet (600 mg total) by mouth every 6 (six) hours. 08/22/15   Brein, Lolita SAUNDERS, MD  Prenatal Vit-Fe Fumarate-FA (PNV PRENATAL PLUS MULTIVITAMIN) 27-1 MG TABS Take 1 tablet by mouth daily.    [provider]    Allergies: Aspirin    Review of Systems  Musculoskeletal:        Knee pain    Updated Vital Signs BP 125/89 (BP Location: Left Arm)   Pulse 85   Temp 98.2 F (36.8 C) (Oral)   Resp 16   Ht 5' 2 (1.575 m)   Wt 78.7 kg   SpO2 100%   BMI 31.75 kg/m   Physical Exam Vitals and nursing  note reviewed.  Constitutional:      Appearance: Normal appearance.  HENT:     Head: Atraumatic.  Cardiovascular:     Comments: 2+ pedal pulses bilaterally Pulmonary:     Effort: Pulmonary effort is normal.  Musculoskeletal:     Comments: Bilateral lower extremity:  General No obvious deformity. No erythema, edema, contusions, open wounds   Palpation Non tender over the femur, tibia and fibula, patella, MCL, LCL Non-tender of the popliteal fossa No calf edema or tenderness to palpation  ROM Full knee flexion and extension Able to ambulate without difficulty  Special tests No ligamentous laxity with Lachman's or posterior drawer testing.  No ligamentous laxity or pain reproduced with valgus or varus stress of the knee.  Negative McMurray's test.  Sensation: Sensation intact throughout the lower extremity  Strength: 5/5 strength with resisted knee flexion and extension  5/5 strength with resisted ankle plantarflexion and dorsiflexion   Neurological:     General: No focal deficit present.     Mental Status: She is alert.  Psychiatric:        Mood and Affect: Mood normal.        Behavior: Behavior normal.     (all  labs ordered are listed, but only abnormal results are displayed) Labs Reviewed - No data to display  EKG: None  Radiology: DG Knee Complete 4 Views Right Result Date: 09/18/2023 CLINICAL DATA:  Right knee pain for 2 months, no known injury, initial encounter EXAM: RIGHT KNEE - COMPLETE 4+ VIEW COMPARISON:  None Available. FINDINGS: No evidence of fracture, dislocation, or joint effusion. No evidence of arthropathy or other focal bone abnormality. Soft tissues are unremarkable. IMPRESSION: No acute abnormality noted. Electronically Signed   By: Oneil Devonshire M.D.   On: 09/18/2023 21:03   DG Knee Complete 4 Views Left Result Date: 09/18/2023 CLINICAL DATA:  Knee pain, no known injury, initial encounter EXAM: LEFT KNEE - COMPLETE 4+ VIEW COMPARISON:  None  Available. FINDINGS: No evidence of fracture, dislocation, or joint effusion. No evidence of arthropathy or other focal bone abnormality. Soft tissues are unremarkable. IMPRESSION: No acute abnormality noted. Electronically Signed   By: Oneil Devonshire M.D.   On: 09/18/2023 21:02     Procedures   Medications Ordered in the ED  lidocaine  (LIDODERM ) 5 % 2 patch (2 patches Transdermal Patch Applied 09/18/23 2138)  naproxen  (NAPROSYN ) tablet 500 mg (500 mg Oral Given 09/18/23 2138)                                    Medical Decision Making Amount and/or Complexity of Data Reviewed Radiology: ordered.  Risk Prescription drug management.     Differential diagnosis includes but is not limited to DVT, osteoarthritis, patellofemoral pain syndrome, gout, muscle strain  ED Course:  Upon initial evaluation, patient is very well-appearing, no acute distress.  Stable vitals.  Reporting bilateral knee pain for the past 2 months.  She does not have any bony tenderness to palpation of the femur, patella, tibia, fibula bilaterally, no known injury, low concern for acute fracture or dislocation.  X-rays without sign of acute fracture or dislocation.  No signs of osteoarthritis on x-ray.  She has full range of motion of the bilateral knees.  Able to ambulate without difficulty.  No ligamentous laxity on exam.  She is neurovascular intact in the bilateral lower extremities.  She does not have any calf swelling or tenderness to palpation, low concern for DVT. Pain only reproduced when she kneels down to pray, and points to pain behind her kneecaps and over the quadriceps tendon.  This is consistent with patellofemoral pain syndrome.   Imaging Studies ordered: I ordered imaging studies including x-ray right knee, x-ray left knee I independently visualized the imaging with scope of interpretation limited to determining acute life threatening conditions related to emergency care. Imaging showed no acute  abnormalities I agree with the radiologist interpretation  Medications Given: Naproxen  Lidocaine  patch  Impression: Patellofemoral pain syndrome  Disposition:  The patient was discharged home with instructions to use Tylenol  or naproxen  as needed for pain.  May apply lidocaine  patches as needed to the knees as needed for pain.  Follow-up with orthopedics, Dr. Gwenevere contact information was provided.  Recommended physical therapy, she understands she can make an appointment herself or get a referral from orthopedics. Return precautions given.    This chart was dictated using voice recognition software, Dragon. Despite the best efforts of this provider to proofread and correct errors, errors may still occur which can change documentation meaning.       Final diagnoses:  Patellofemoral pain syndrome of both knees  ED Discharge Orders          Ordered    lidocaine  (LIDODERM ) 5 %  Every 24 hours        09/18/23 2137               Veta Palma, PA-C 09/18/23 2141    Rogelia Jerilynn RAMAN, MD 09/20/23 1535

## 2023-09-18 NOTE — Discharge Instructions (Addendum)
 Your x-rays today do not show any bony abnormality to explain your knee pain.  Your pain seems to be due to patellofemoral pain syndrome which is when the quadricepts tendon in your knee becomes inflamed from repetitive use like when squatting or kneeling.   Avoid movements and activities that are painful.  You may apply ice or heat to the knee as needed to help with pain.  You may also use a compression sleeve to help provide some support to the knee help with pain.  Compression sleeves can be found at any drugstore.  You may take up to 1000mg  of tylenol  every 6 hours as needed for pain. Do not take more then 4g per day.   You may use up to 600mg  ibuprofen  every 6 hours as needed for pain.  Do not exceed 2.4g of ibuprofen  per day OR 500mg  of naproxen  (Aleve ) every 12 hours as needed for pain.  You were given 500mg  of Aleve  here today.  You have been prescribed lidocaine  patches to use on your knees as needed for pain.  You may apply 1 patch for up to 12 hours at a time.  You must remove the patch for full 12 hours before reapplying a new patch.   Gradually return to activity as pain allows. Try to engage in non-painful types of physical activity/exercise to increase blood flow to your area of injury.  Please follow-up with the orthopedic doctor listed below within the next 2 weeks for your knees.  Physical therapy can be helpful, and you can obtain a referral from the orthopedic doctor, or make an appointment by yourself with a physical therapy office of your choice.

## 2023-09-18 NOTE — ED Notes (Signed)
 Reviewed D/C information with the patient, pt verbalized understanding. No additional concerns at this time.

## 2023-09-28 DIAGNOSIS — M25562 Pain in left knee: Secondary | ICD-10-CM | POA: Diagnosis not present

## 2023-09-28 DIAGNOSIS — M25561 Pain in right knee: Secondary | ICD-10-CM | POA: Diagnosis not present

## 2023-12-19 DIAGNOSIS — Z01419 Encounter for gynecological examination (general) (routine) without abnormal findings: Secondary | ICD-10-CM | POA: Diagnosis not present

## 2023-12-19 DIAGNOSIS — Z113 Encounter for screening for infections with a predominantly sexual mode of transmission: Secondary | ICD-10-CM | POA: Diagnosis not present

## 2023-12-19 DIAGNOSIS — Z3009 Encounter for other general counseling and advice on contraception: Secondary | ICD-10-CM | POA: Diagnosis not present

## 2023-12-19 DIAGNOSIS — Z114 Encounter for screening for human immunodeficiency virus [HIV]: Secondary | ICD-10-CM | POA: Diagnosis not present

## 2023-12-19 DIAGNOSIS — Z30432 Encounter for removal of intrauterine contraceptive device: Secondary | ICD-10-CM | POA: Diagnosis not present

## 2023-12-19 DIAGNOSIS — Z131 Encounter for screening for diabetes mellitus: Secondary | ICD-10-CM | POA: Diagnosis not present

## 2023-12-19 DIAGNOSIS — Z30431 Encounter for routine checking of intrauterine contraceptive device: Secondary | ICD-10-CM | POA: Diagnosis not present

## 2024-01-05 ENCOUNTER — Other Ambulatory Visit: Payer: Self-pay | Admitting: Nurse Practitioner

## 2024-01-05 DIAGNOSIS — Z1231 Encounter for screening mammogram for malignant neoplasm of breast: Secondary | ICD-10-CM

## 2024-01-31 ENCOUNTER — Ambulatory Visit
Admission: RE | Admit: 2024-01-31 | Discharge: 2024-01-31 | Disposition: A | Discharge status: Home / Self Care | Source: Ambulatory Visit | Attending: Nurse Practitioner | Admitting: Nurse Practitioner

## 2024-01-31 DIAGNOSIS — Z1231 Encounter for screening mammogram for malignant neoplasm of breast: Secondary | ICD-10-CM
